# Patient Record
Sex: Female | Born: 1935 | Race: Black or African American | Hispanic: No | Marital: Single | Smoking: Current every day smoker
Health system: Southern US, Community
[De-identification: ages and names within clinical notes are randomized; demographics above are authoritative.]

## PROBLEM LIST (undated history)

## (undated) HISTORY — PX: CATARACT EXTRACTION: SUR2

---

## 2016-12-06 ENCOUNTER — Inpatient Hospital Stay: Payer: Medicare Other

## 2016-12-06 ENCOUNTER — Emergency Department: Payer: Medicare Other

## 2016-12-06 ENCOUNTER — Encounter: Payer: Self-pay | Admitting: Emergency Medicine

## 2016-12-06 ENCOUNTER — Inpatient Hospital Stay (HOSPITAL_COMMUNITY)
Admit: 2016-12-06 | Discharge: 2016-12-06 | Disposition: A | Discharge status: Home / Self Care | Payer: Medicare Other | Attending: Internal Medicine | Admitting: Internal Medicine

## 2016-12-06 ENCOUNTER — Inpatient Hospital Stay
Admission: EM | Admit: 2016-12-06 | Discharge: 2016-12-11 | DRG: 871 | Disposition: A | Discharge status: Skilled Nursing Facility | Payer: Medicare Other | Attending: Internal Medicine | Admitting: Internal Medicine

## 2016-12-06 DIAGNOSIS — R180 Malignant ascites: Secondary | ICD-10-CM | POA: Diagnosis present

## 2016-12-06 DIAGNOSIS — C799 Secondary malignant neoplasm of unspecified site: Secondary | ICD-10-CM | POA: Diagnosis not present

## 2016-12-06 DIAGNOSIS — R64 Cachexia: Secondary | ICD-10-CM | POA: Diagnosis present

## 2016-12-06 DIAGNOSIS — E43 Unspecified severe protein-calorie malnutrition: Secondary | ICD-10-CM | POA: Diagnosis present

## 2016-12-06 DIAGNOSIS — Z681 Body mass index (BMI) 19 or less, adult: Secondary | ICD-10-CM

## 2016-12-06 DIAGNOSIS — R110 Nausea: Secondary | ICD-10-CM | POA: Diagnosis not present

## 2016-12-06 DIAGNOSIS — I639 Cerebral infarction, unspecified: Secondary | ICD-10-CM | POA: Diagnosis not present

## 2016-12-06 DIAGNOSIS — B9620 Unspecified Escherichia coli [E. coli] as the cause of diseases classified elsewhere: Secondary | ICD-10-CM | POA: Diagnosis present

## 2016-12-06 DIAGNOSIS — Z23 Encounter for immunization: Secondary | ICD-10-CM | POA: Diagnosis present

## 2016-12-06 DIAGNOSIS — G8191 Hemiplegia, unspecified affecting right dominant side: Secondary | ICD-10-CM | POA: Diagnosis present

## 2016-12-06 DIAGNOSIS — N390 Urinary tract infection, site not specified: Secondary | ICD-10-CM | POA: Diagnosis present

## 2016-12-06 DIAGNOSIS — R160 Hepatomegaly, not elsewhere classified: Secondary | ICD-10-CM | POA: Diagnosis not present

## 2016-12-06 DIAGNOSIS — M6281 Muscle weakness (generalized): Secondary | ICD-10-CM | POA: Diagnosis not present

## 2016-12-06 DIAGNOSIS — D62 Acute posthemorrhagic anemia: Secondary | ICD-10-CM | POA: Diagnosis present

## 2016-12-06 DIAGNOSIS — R17 Unspecified jaundice: Secondary | ICD-10-CM

## 2016-12-06 DIAGNOSIS — R5381 Other malaise: Secondary | ICD-10-CM | POA: Diagnosis not present

## 2016-12-06 DIAGNOSIS — N179 Acute kidney failure, unspecified: Secondary | ICD-10-CM | POA: Diagnosis present

## 2016-12-06 DIAGNOSIS — R627 Adult failure to thrive: Secondary | ICD-10-CM | POA: Diagnosis present

## 2016-12-06 DIAGNOSIS — K922 Gastrointestinal hemorrhage, unspecified: Secondary | ICD-10-CM | POA: Diagnosis not present

## 2016-12-06 DIAGNOSIS — E872 Acidosis: Secondary | ICD-10-CM | POA: Diagnosis present

## 2016-12-06 DIAGNOSIS — R000 Tachycardia, unspecified: Secondary | ICD-10-CM | POA: Diagnosis present

## 2016-12-06 DIAGNOSIS — Z515 Encounter for palliative care: Secondary | ICD-10-CM | POA: Diagnosis not present

## 2016-12-06 DIAGNOSIS — D638 Anemia in other chronic diseases classified elsewhere: Secondary | ICD-10-CM | POA: Diagnosis present

## 2016-12-06 DIAGNOSIS — R6520 Severe sepsis without septic shock: Secondary | ICD-10-CM | POA: Diagnosis present

## 2016-12-06 DIAGNOSIS — E860 Dehydration: Secondary | ICD-10-CM | POA: Diagnosis present

## 2016-12-06 DIAGNOSIS — R2681 Unsteadiness on feet: Secondary | ICD-10-CM

## 2016-12-06 DIAGNOSIS — Z66 Do not resuscitate: Secondary | ICD-10-CM

## 2016-12-06 DIAGNOSIS — A419 Sepsis, unspecified organism: Secondary | ICD-10-CM | POA: Diagnosis present

## 2016-12-06 DIAGNOSIS — R748 Abnormal levels of other serum enzymes: Secondary | ICD-10-CM | POA: Diagnosis not present

## 2016-12-06 DIAGNOSIS — A4151 Sepsis due to Escherichia coli [E. coli]: Principal | ICD-10-CM | POA: Diagnosis present

## 2016-12-06 DIAGNOSIS — C221 Intrahepatic bile duct carcinoma: Secondary | ICD-10-CM | POA: Diagnosis present

## 2016-12-06 DIAGNOSIS — F17210 Nicotine dependence, cigarettes, uncomplicated: Secondary | ICD-10-CM | POA: Diagnosis present

## 2016-12-06 DIAGNOSIS — Z800 Family history of malignant neoplasm of digestive organs: Secondary | ICD-10-CM

## 2016-12-06 DIAGNOSIS — Z8673 Personal history of transient ischemic attack (TIA), and cerebral infarction without residual deficits: Secondary | ICD-10-CM

## 2016-12-06 DIAGNOSIS — R5383 Other fatigue: Secondary | ICD-10-CM | POA: Diagnosis not present

## 2016-12-06 DIAGNOSIS — L8990 Pressure ulcer of unspecified site, unspecified stage: Secondary | ICD-10-CM | POA: Insufficient documentation

## 2016-12-06 DIAGNOSIS — Z79899 Other long term (current) drug therapy: Secondary | ICD-10-CM | POA: Diagnosis not present

## 2016-12-06 DIAGNOSIS — Z7189 Other specified counseling: Secondary | ICD-10-CM | POA: Diagnosis not present

## 2016-12-06 DIAGNOSIS — L89152 Pressure ulcer of sacral region, stage 2: Secondary | ICD-10-CM | POA: Diagnosis present

## 2016-12-06 DIAGNOSIS — R531 Weakness: Secondary | ICD-10-CM | POA: Diagnosis not present

## 2016-12-06 DIAGNOSIS — I10 Essential (primary) hypertension: Secondary | ICD-10-CM | POA: Diagnosis present

## 2016-12-06 DIAGNOSIS — R1900 Intra-abdominal and pelvic swelling, mass and lump, unspecified site: Secondary | ICD-10-CM | POA: Diagnosis not present

## 2016-12-06 LAB — URINALYSIS, COMPLETE (UACMP) WITH MICROSCOPIC
GLUCOSE, UA: NEGATIVE mg/dL
Ketones, ur: NEGATIVE mg/dL
Nitrite: NEGATIVE
PROTEIN: 30 mg/dL — AB
SPECIFIC GRAVITY, URINE: 1.017 (ref 1.005–1.030)
pH: 5 (ref 5.0–8.0)

## 2016-12-06 LAB — BRAIN NATRIURETIC PEPTIDE: B NATRIURETIC PEPTIDE 5: 73 pg/mL (ref 0.0–100.0)

## 2016-12-06 LAB — BASIC METABOLIC PANEL
Anion gap: 14 (ref 5–15)
BUN: 44 mg/dL — AB (ref 6–20)
CALCIUM: 8.1 mg/dL — AB (ref 8.9–10.3)
CHLORIDE: 103 mmol/L (ref 101–111)
CO2: 18 mmol/L — ABNORMAL LOW (ref 22–32)
CREATININE: 1.5 mg/dL — AB (ref 0.44–1.00)
GFR, EST AFRICAN AMERICAN: 37 mL/min — AB (ref >60)
GFR, EST NON AFRICAN AMERICAN: 32 mL/min — AB (ref >60)
Glucose, Bld: 128 mg/dL — ABNORMAL HIGH (ref 65–99)
Potassium: 3.6 mmol/L (ref 3.5–5.1)
SODIUM: 135 mmol/L (ref 135–145)

## 2016-12-06 LAB — DIFFERENTIAL
Basophils Absolute: 0 10*3/uL (ref 0–0.1)
Basophils Relative: 0 %
EOS PCT: 0 %
Eosinophils Absolute: 0 10*3/uL (ref 0–0.7)
Lymphocytes Relative: 7 %
Lymphs Abs: 1.6 10*3/uL (ref 1.0–3.6)
MONO ABS: 0.6 10*3/uL (ref 0.2–0.9)
Monocytes Relative: 3 %
NEUTROS PCT: 90 %
Neutro Abs: 20.8 10*3/uL — ABNORMAL HIGH (ref 1.4–6.5)

## 2016-12-06 LAB — CREATININE, SERUM
Creatinine, Ser: 1.32 mg/dL — ABNORMAL HIGH (ref 0.44–1.00)
GFR calc non Af Amer: 37 mL/min — ABNORMAL LOW (ref >60)
GFR, EST AFRICAN AMERICAN: 43 mL/min — AB (ref >60)

## 2016-12-06 LAB — APTT: APTT: 28 s (ref 24–36)

## 2016-12-06 LAB — HEPATIC FUNCTION PANEL
ALBUMIN: 1.5 g/dL — AB (ref 3.5–5.0)
ALT: 76 U/L — AB (ref 14–54)
AST: 159 U/L — AB (ref 15–41)
Alkaline Phosphatase: 431 U/L — ABNORMAL HIGH (ref 38–126)
BILIRUBIN DIRECT: 6.8 mg/dL — AB (ref 0.1–0.5)
Indirect Bilirubin: 4.5 mg/dL — ABNORMAL HIGH (ref 0.3–0.9)
TOTAL PROTEIN: 6 g/dL — AB (ref 6.5–8.1)
Total Bilirubin: 11.3 mg/dL — ABNORMAL HIGH (ref 0.3–1.2)

## 2016-12-06 LAB — CBC
HCT: 24.4 % — ABNORMAL LOW (ref 35.0–47.0)
Hemoglobin: 8.4 g/dL — ABNORMAL LOW (ref 12.0–16.0)
MCH: 31.9 pg (ref 26.0–34.0)
MCHC: 34.3 g/dL (ref 32.0–36.0)
MCV: 93 fL (ref 80.0–100.0)
PLATELETS: 630 10*3/uL — AB (ref 150–440)
RBC: 2.62 MIL/uL — AB (ref 3.80–5.20)
RDW: 19.2 % — AB (ref 11.5–14.5)
WBC: 26.2 10*3/uL — AB (ref 3.6–11.0)

## 2016-12-06 LAB — LACTIC ACID, PLASMA
LACTIC ACID, VENOUS: 3.8 mmol/L — AB (ref 0.5–1.9)
LACTIC ACID, VENOUS: 3.8 mmol/L — AB (ref 0.5–1.9)

## 2016-12-06 LAB — TROPONIN I: Troponin I: 0.03 ng/mL (ref <0.03)

## 2016-12-06 LAB — PROTIME-INR
INR: 1.17
Prothrombin Time: 15 seconds (ref 11.4–15.2)

## 2016-12-06 LAB — PROCALCITONIN: Procalcitonin: 2.3 ng/mL

## 2016-12-06 MED ORDER — CEFTRIAXONE SODIUM-DEXTROSE 1-3.74 GM-% IV SOLR
1.0000 g | INTRAVENOUS | Status: DC
  Administered 2016-12-07 – 2016-12-10 (×4): 1 g via INTRAVENOUS
  Filled 2016-12-06 (×3): qty 50

## 2016-12-06 MED ORDER — INFLUENZA VAC SPLIT QUAD 0.5 ML IM SUSY
0.5000 mL | PREFILLED_SYRINGE | INTRAMUSCULAR | Status: AC
  Administered 2016-12-08: 0.5 mL via INTRAMUSCULAR
  Filled 2016-12-06 (×2): qty 0.5

## 2016-12-06 MED ORDER — CEFTRIAXONE SODIUM-DEXTROSE 1-3.74 GM-% IV SOLR
1.0000 g | Freq: Once | INTRAVENOUS | Status: AC
  Administered 2016-12-06: 1 g via INTRAVENOUS
  Filled 2016-12-06: qty 50

## 2016-12-06 MED ORDER — SENNOSIDES-DOCUSATE SODIUM 8.6-50 MG PO TABS: 1.0000 | ORAL_TABLET | Freq: Every evening | ORAL | Status: DC | PRN

## 2016-12-06 MED ORDER — SIMVASTATIN 40 MG PO TABS
40.0000 mg | ORAL_TABLET | Freq: Every day | ORAL | Status: DC
  Administered 2016-12-06: 20:00:00 40 mg via ORAL
  Filled 2016-12-06: qty 1

## 2016-12-06 MED ORDER — SODIUM CHLORIDE 0.9 % IV SOLN
1000.0000 mL | Freq: Once | INTRAVENOUS | Status: AC
  Administered 2016-12-06: 1000 mL via INTRAVENOUS

## 2016-12-06 MED ORDER — DEXTROSE 5 % IV SOLN: 1.0000 g | Freq: Once | INTRAVENOUS | Status: DC

## 2016-12-06 MED ORDER — PNEUMOCOCCAL VAC POLYVALENT 25 MCG/0.5ML IJ INJ
0.5000 mL | INJECTION | INTRAMUSCULAR | Status: AC
  Administered 2016-12-09: 0.5 mL via INTRAMUSCULAR
  Filled 2016-12-06: qty 0.5

## 2016-12-06 MED ORDER — ASPIRIN 81 MG PO CHEW
324.0000 mg | CHEWABLE_TABLET | Freq: Once | ORAL | Status: AC
  Administered 2016-12-06: 324 mg via ORAL
  Filled 2016-12-06: qty 4

## 2016-12-06 MED ORDER — ACETAMINOPHEN 325 MG PO TABS
650.0000 mg | ORAL_TABLET | ORAL | Status: DC | PRN
  Administered 2016-12-07 – 2016-12-10 (×2): 650 mg via ORAL
  Filled 2016-12-06 (×2): qty 2

## 2016-12-06 MED ORDER — STROKE: EARLY STAGES OF RECOVERY BOOK
Freq: Once | Status: AC
  Administered 2016-12-06: 15:00:00

## 2016-12-06 MED ORDER — ATORVASTATIN CALCIUM 20 MG PO TABS: 40.0000 mg | ORAL_TABLET | Freq: Every day | ORAL | Status: DC

## 2016-12-06 MED ORDER — ACETAMINOPHEN 160 MG/5ML PO SOLN: 650.0000 mg | ORAL | Status: DC | PRN

## 2016-12-06 MED ORDER — HEPARIN SODIUM (PORCINE) 5000 UNIT/ML IJ SOLN
5000.0000 [IU] | Freq: Three times a day (TID) | INTRAMUSCULAR | Status: DC
  Administered 2016-12-06 – 2016-12-07 (×3): 5000 [IU] via SUBCUTANEOUS
  Filled 2016-12-06 (×3): qty 1

## 2016-12-06 MED ORDER — ASPIRIN 325 MG PO TABS: 325.0000 mg | ORAL_TABLET | Freq: Every day | ORAL | Status: DC

## 2016-12-06 MED ORDER — ACETAMINOPHEN 650 MG RE SUPP: 650.0000 mg | RECTAL | Status: DC | PRN

## 2016-12-06 MED ORDER — ASPIRIN 300 MG RE SUPP: 300.0000 mg | Freq: Every day | RECTAL | Status: DC

## 2016-12-06 MED ORDER — SODIUM CHLORIDE 0.9 % IV SOLN
INTRAVENOUS | Status: DC
  Administered 2016-12-06 – 2016-12-10 (×7): via INTRAVENOUS

## 2016-12-06 NOTE — H&P (Signed)
[facility] NAME: Grace Summers    MR#:  [id number]  DATE OF BIRTH:  [birth year]  DATE OF ADMISSION:  12/06/2016  PRIMARY CARE PHYSICIAN: No PCP Per Patient   REQUESTING/REFERRING PHYSICIAN: Lavonia Drafts, MD  CHIEF COMPLAINT:   Chief Complaint  Patient presents with  . Weakness  . Leg Swelling  . Failure To Thrive   Failure to thrive, weight loss for a few months and right-sided weakness for 10 days HISTORY OF PRESENT ILLNESS:  Grace Summers  is a 83 y.o. female withNo past medical history. Was sent to ED by family member due to above chief complaints. The patient has had significant weight loss for the past a few days, he has decreased appetite and generalized weakness. She complains right side weakness for 10 days. But she denies any slurred speech, dysphagia or incontinence. She denies any fever or chills, no cough, phlegm or shortness of breath. Denies any dysuria, hematuria or urinary frequency but has dark urine. She was found leukocytosis and tachycardia. Urinalysis show UTI. CAT scan of head so acute or subacute CVA. She is treated with aspirin and Rocephin in the ED.  PAST MEDICAL HISTORY:  History reviewed. No pertinent past medical history.  PAST SURGICAL HISTORY:   Past Surgical History:  Procedure Laterality Date  . CATARACT EXTRACTION      SOCIAL HISTORY:   Social History  Substance Use Topics  . Smoking status: Current Every Day Smoker    Packs/day: 0.50    Years: 60.00    Types: Cigarettes  . Smokeless tobacco: Never Used  . Alcohol use No    FAMILY HISTORY:   Family History  Problem Relation Age of Onset  . Breast cancer Mother   . Liver cancer Father     DRUG ALLERGIES:  No Known Allergies  REVIEW OF SYSTEMS:   Review of Systems  Constitutional: Positive for malaise/fatigue and weight loss. Negative for chills and fever.  HENT: Negative for congestion, nosebleeds and sore throat.     Eyes: Negative for blurred vision and double vision.  Respiratory: Negative for cough, hemoptysis, sputum production, shortness of breath, wheezing and stridor.   Cardiovascular: Positive for leg swelling. Negative for chest pain and palpitations.  Gastrointestinal: Negative for abdominal pain, blood in stool, constipation, diarrhea, melena, nausea and vomiting.  Genitourinary: Negative for dysuria, frequency and hematuria.  Musculoskeletal: Negative for back pain and joint pain.  Skin: Negative for itching and rash.  Neurological: Positive for focal weakness and weakness. Negative for dizziness, tingling, tremors, sensory change, speech change, loss of consciousness and headaches.  Endo/Heme/Allergies: Does not bruise/bleed easily.  Psychiatric/Behavioral: Negative for depression. The patient is not nervous/anxious.     MEDICATIONS AT HOME:   Prior to Admission medications   Not on File      VITAL SIGNS:  Blood pressure (!) 167/67, pulse 86, temperature 97.3 F (36.3 C), temperature source Oral, resp. rate 18, height 5\' 5"  (1.651 m), weight 100 lb (45.4 kg), SpO2 99 %.  PHYSICAL EXAMINATION:  Physical Exam  GENERAL:  83 y.o.-year-old patient lying in the bed with no acute distress. Severe malnutrition EYES: Pupils equal, round, reactive to light and accommodation. Has scleral icterus. Extraocular muscles intact.  HEENT: Head atraumatic, normocephalic. Oropharynx and nasopharynx clear. Dry oral mucosa. NECK:  Supple, no jugular venous distention. No thyroid enlargement, no tenderness.  LUNGS: Normal breath sounds bilaterally, no wheezing, rales,rhonchi or crepitation. No use of accessory muscles  of respiration.  CARDIOVASCULAR: S1, S2 normal. No murmurs, rubs, or gallops.  ABDOMEN: Soft, nontender, nondistended. Bowel sounds present. No organomegaly or mass.  EXTREMITIES: No pedal edema, cyanosis, or clubbing.  NEUROLOGIC: Cranial nerves II through XII are intact. Muscle strength  4/5 in all extremities. Sensation intact. Gait not checked.  PSYCHIATRIC: The patient is alert and oriented x 3.  SKIN: No obvious rash, lesion, or ulcer.   LABORATORY PANEL:   CBC  Recent Labs Lab 12/06/16 0926  WBC 26.2*  HGB 8.4*  HCT 24.4*  PLT 630*   ------------------------------------------------------------------------------------------------------------------  Chemistries   Recent Labs Lab 12/06/16 0926  NA 135  K 3.6  CL 103  CO2 18*  GLUCOSE 128*  BUN 44*  CREATININE 1.50*  CALCIUM 8.1*   ------------------------------------------------------------------------------------------------------------------  Cardiac Enzymes  Recent Labs Lab 12/06/16 0926  TROPONINI 0.03*   ------------------------------------------------------------------------------------------------------------------  RADIOLOGY:  Dg Chest 1 View  Result Date: 12/06/2016 CLINICAL DATA:  Weight loss EXAM: CHEST 1 VIEW COMPARISON:  None. FINDINGS: There is slight scarring in each upper lobe and right base regions. There is no edema or consolidation. Heart size and pulmonary vascularity are normal. No adenopathy. Calcification is noted in the left carotid artery. IMPRESSION: Scattered areas of mild scarring. No edema or consolidation. There is calcification, incompletely visualized, in the left carotid artery. Electronically Signed   By: Lowella Grip III M.D.   On: 12/06/2016 10:49   Ct Head Wo Contrast  Result Date: 12/06/2016 CLINICAL DATA:  82 year old female with right lower extremity weakness for 1 week. Initial encounter. EXAM: CT HEAD WITHOUT CONTRAST TECHNIQUE: Contiguous axial images were obtained from the base of the skull through the vertex without intravenous contrast. COMPARISON:  None. FINDINGS: Brain: Extra-axial CSF over the frontal convexities appears related to age related cerebral volume loss. There is patchy hypodensity in the left pons (series 2, image 12). There is  relatively little deep gray matter and cerebral white matter hypodensity elsewhere ; there is some involvement of the right basal ganglia. No superimposed cortical encephalomalacia identified. No midline shift, ventriculomegaly, mass effect, evidence of mass lesion, intracranial hemorrhage or evidence of cortically based acute infarction. Vascular: Calcified atherosclerosis at the skull base. No suspicious intracranial vascular hyperdensity. Skull: Osteopenia. Hyperostosis of the calvarium. No acute osseous abnormality identified. Sinuses/Orbits: Clear. Other: No acute orbit or scalp soft tissue finding. IMPRESSION: 1. Age indeterminate hypodensity in the left thalamus is suspicious for an acute to subacute lacunar infarct in this clinical setting. No associated hemorrhage or mass effect. 2. Otherwise suspect relatively mild for age chronic small vessel disease. Electronically Signed   By: Genevie Ann M.D.   On: 12/06/2016 10:39      IMPRESSION AND PLAN:   Sepsis with UTI. The patient will be admitted to medical floor. Continue Rocephin, follow-up CBC and cultures.  Acute or subacute CVA. Continue aspirin, A. fib for, check lipid panel, MRI/MRA of brain, carotid duplex, echocardiogram,  neuro check and neurology consult.  Acute renal failure due to dehydration. Start IV fluid support and follow-up BMP. Lactic acidosis, treatment as above, follow-up lactic acid level. Failure to thrive. Dietitian consult. May need workup for malignancy. May get a CAT scan of abdomen and pelvis if renal function improves. I will get abdominal ultrasound and liver function test.  Jaundice, follow-up abdominal ultrasound and liver function test.  Anemia. Possible due to chronic disease and malnutrition.  Tobacco abuse. Smoking cessation was counseled for 3 minutes, nicotine patch.  All the records are  reviewed and case discussed with ED provider. Management plans discussed with the patient, sister and daughter and  they are in agreement.  CODE STATUS: Full code  TOTAL TIME TAKING CARE OF THIS PATIENT: 65 minutes.    Demetrios Loll M.D on 12/06/2016 at 2:11 PM  Between 7am to 6pm - Pager - [phone]  After 6pm go to [email or web address] - [employer] [facility]  Office  [phone]  CC: Primary care physician; No PCP Per Patient   Note: This dictation was prepared with Dragon dictation along with smaller phrase technology. Any transcriptional errors that result from this process are unintentional.

## 2016-12-06 NOTE — Progress Notes (Signed)
Pharmacy Antibiotic Note  Grace Summers is a 83 y.o. female admitted on 12/06/2016 with UTI.  Pharmacy has been consulted for ceftriaxone dosing.  Plan: Ceftriaxone 1 g IV daily  Height: 5\' 3"  (160 cm) Weight: 91 lb 8 oz (41.5 kg) IBW/kg (Calculated) : 52.4  Temp (24hrs), Avg:97.5 F (36.4 C), Min:97.3 F (36.3 C), Max:97.6 F (36.4 C)   Recent Labs Lab 12/06/16 0926 12/06/16 1107 12/06/16 1331 12/06/16 1421  WBC 26.2*  --   --   --   CREATININE 1.50*  --   --  1.32*  LATICACIDVEN  --  3.8* 3.8*  --     Estimated Creatinine Clearance: 22.3 mL/min (by C-G formula based on SCr of 1.32 mg/dL (H)).    No Known Allergies  Antimicrobials this admission: CTX 1/23 >>   Dose adjustments this admission:  Microbiology results: 1/23 BCx: Sent 1/23 UCx: Sent   Thank you for allowing pharmacy to be a part of this patient's care.  Lenis Noon, PharmD, BCPS Clinical Pharmacist 12/06/2016 3:20 PM

## 2016-12-06 NOTE — ED Triage Notes (Signed)
Pt presents to ED with decreased appetite, swelling in her feet, and generalized weakness at this time. Pt lives at home with her sister. Pt presents with facial symmetry intact, grip strengths weak and equal bilaterally. Pt is alert and oriented at this time.

## 2016-12-06 NOTE — Progress Notes (Signed)
PT Cancellation Note  Patient Details Name: Grace Summers MRN: [id number] DOB: 1934/06/19   Cancelled Treatment:    Reason Eval/Treat Not Completed: Patient at procedure or test/unavailable Pt out of room for MRI, will try back tomorrow as appropriate.  Kreg Shropshire, DPT 12/06/2016, 4:11 PM

## 2016-12-06 NOTE — Progress Notes (Signed)
Notified  Dr Bridgett Larsson of small amt of bright bleeding per rectum. Dr Bridgett Larsson said he had ordered   Sung Amabile for pt. prioe consitpation per pt

## 2016-12-06 NOTE — Progress Notes (Signed)
OT Cancellation Note  Patient Details Name: Grace Summers MRN: [id number] DOB: Mar 31, 1934   Cancelled Treatment:    Reason Eval/Treat Not Completed: Patient at procedure or test/ unavailable. Order received, chart reviewed. Pt off floor for MRI per nsg. Will re-attempt OT evaluation at later date/time as appropriate.  Corky Sox, OTR/L 12/06/2016, 4:00 PM

## 2016-12-06 NOTE — ED Notes (Signed)
Patient transported to X-ray 

## 2016-12-06 NOTE — ED Provider Notes (Signed)
[facility] Emergency Department Provider Note   ____________________________________________    I have reviewed the triage vital signs and the nursing notes.   HISTORY  Chief Complaint Weakness; Leg Swelling; and Failure To Thrive     HPI Grace Summers is a 83 y.o. female who presents with several complaints. Patient reports over the last several months she has had significant weight loss, loss of appetite and general fatigue. She also developed right leg weakness which she states seems to become more pronounced in the last week. She denies chest pain. She denies headache. She denies cough or shortness of breath. She is a smoker. No abdominal pain. No black stools   History reviewed. No pertinent past medical history.  There are no active problems to display for this patient.   Past Surgical History:  Procedure Laterality Date  . CATARACT EXTRACTION      Prior to Admission medications   Not on File     Allergies Patient has no known allergies.  History reviewed. No pertinent family history.  Social History Social History  Substance Use Topics  . Smoking status: Current Every Day Smoker    Packs/day: 0.50    Types: Cigarettes  . Smokeless tobacco: Never Used  . Alcohol use No    Review of Systems  Constitutional: No fever/chills  Cardiovascular: Denies chest pain.No cough Respiratory: Denies shortness of breath. Gastrointestinal: No abdominal pain.  No nausea, no vomiting.   Genitourinary: Negative for dysuria. Musculoskeletal: Negative for back pain. Skin: Negative for rash. Neurological: Negative for headaches , right leg weakness as above  10-point ROS otherwise negative.  ____________________________________________   PHYSICAL EXAM:  VITAL SIGNS: ED Triage Vitals [12/06/16 0910]  Enc Vitals Group     BP (!) 146/80     Pulse Rate (!) 110     Resp 18     Temp 97.6 F (36.4 C)     Temp Source Oral     SpO2 98  %     Weight 100 lb (45.4 kg)     Height 5\' 5"  (1.651 m)     Head Circumference      Peak Flow      Pain Score      Pain Loc      Pain Edu?      Excl. in [place]?     Constitutional: Alert and oriented. No acute distress. Pleasant and interactive, Cachectic Eyes: Conjunctivae are normal.  Head: Atraumatic. Nose: No congestion/rhinnorhea. Mouth/Throat: Mucous membranes are moist.   Neck:  Painless ROM Cardiovascular: Tachycardia, regular rhythm. Grossly normal heart sounds.  Good peripheral circulation. Respiratory: Normal respiratory effort.  No retractions. Lungs CTAB. Gastrointestinal: Soft and nontender. No distention.  No CVA tenderness. Genitourinary: deferred Musculoskeletal: 2+ edema right lower extremity.  Warm and well perfused Neurologic:  Normal speech and language. No gross focal neurologic deficits are appreciated.  Skin:  Skin is warm, dry and intact. No rash noted. Psychiatric: Mood and affect are normal. Speech and behavior are normal.  ____________________________________________   LABS (all labs ordered are listed, but only abnormal results are displayed)  Labs Reviewed  BASIC METABOLIC PANEL - Abnormal; Notable for the following:       Result Value   CO2 18 (*)    Glucose, Bld 128 (*)    BUN 44 (*)    Creatinine, Ser 1.50 (*)    Calcium 8.1 (*)    GFR calc non Af Amer 32 (*)  GFR calc Af Amer 37 (*)    All other components within normal limits  CBC - Abnormal; Notable for the following:    WBC 26.2 (*)    RBC 2.62 (*)    Hemoglobin 8.4 (*)    HCT 24.4 (*)    RDW 19.2 (*)    Platelets 630 (*)    All other components within normal limits  TROPONIN I - Abnormal; Notable for the following:    Troponin I 0.03 (*)    All other components within normal limits  CULTURE, BLOOD (ROUTINE X 2)  CULTURE, BLOOD (ROUTINE X 2)  BRAIN NATRIURETIC PEPTIDE  URINALYSIS, COMPLETE (UACMP) WITH MICROSCOPIC  LACTIC ACID, PLASMA  LACTIC ACID, PLASMA  DIFFERENTIAL    DIFFERENTIAL   ____________________________________________  EKG  ED ECG REPORT I, Lavonia Drafts, the attending physician, personally viewed and interpreted this ECG.  Date: 12/06/2016 EKG Time: 9:27 AM Rate: 107 Rhythm: Sinus tachycardia QRS Axis: normal Intervals: normal ST/T Wave abnormalities: normal Conduction Disturbances: none   ____________________________________________  RADIOLOGY  Chest x-ray remarkable CT scan demonstrates acute to subacute lacunar infarct left thalamus ____________________________________________   PROCEDURES  Procedure(s) performed: No    Critical Care performed: No ____________________________________________   INITIAL IMPRESSION / ASSESSMENT AND PLAN / ED COURSE  Pertinent labs & imaging results that were available during my care of the patient were reviewed by me and considered in my medical decision making (see chart for details).  Patient presents with weight loss and weakness fatigue and general failure to thrive. She is cachectic and frail. Lab work is significant for elevated white blood cell count, anemia and dehydration. She is afebrile and no evidence of bacterial infection at this time, suspect dehydration as the cause of her tachycardia. CT head demonstrates acute subacute lacunar infarct which could explain her right leg weakness. She will require admission for further evaluation  ----------------------------------------- 12:33 PM on 12/06/2016 -----------------------------------------  Notified of elevated lactic acid, subsequently nurse was able to obtain urine via in and out catheter which certainly looks infected, we will treat with Rocephin. Blood pressure is unremarkable heart rate has improved no indication for 30 MLS per kilogram.  ____________________________________________   FINAL CLINICAL IMPRESSION(S) / ED DIAGNOSES  Final diagnoses:  Sepsis, due to unspecified organism [facility])  Cerebrovascular  accident (CVA), unspecified mechanism ([place])      NEW MEDICATIONS STARTED DURING THIS VISIT:  New Prescriptions   No medications on file     Note:  This document was prepared using Dragon voice recognition software and may include unintentional dictation errors.    Lavonia Drafts, MD 12/06/16 1233

## 2016-12-07 ENCOUNTER — Inpatient Hospital Stay: Payer: Medicare Other

## 2016-12-07 DIAGNOSIS — L8990 Pressure ulcer of unspecified site, unspecified stage: Secondary | ICD-10-CM | POA: Insufficient documentation

## 2016-12-07 DIAGNOSIS — R531 Weakness: Secondary | ICD-10-CM

## 2016-12-07 LAB — ECHOCARDIOGRAM COMPLETE
HEIGHTINCHES: 63 in
WEIGHTICAEL: 1464 [oz_av]

## 2016-12-07 LAB — LIPID PANEL
Cholesterol: 157 mg/dL (ref 0–200)
HDL: <10 mg/dL — ABNORMAL LOW (ref >40)
TRIGLYCERIDES: 206 mg/dL — AB (ref <150)
VLDL: 41 mg/dL — AB (ref 0–40)

## 2016-12-07 MED ORDER — ASPIRIN EC 81 MG PO TBEC: 81.0000 mg | DELAYED_RELEASE_TABLET | Freq: Every day | ORAL | Status: DC

## 2016-12-07 MED ORDER — ORAL CARE MOUTH RINSE
15.0000 mL | Freq: Two times a day (BID) | OROMUCOSAL | Status: DC
  Administered 2016-12-07 – 2016-12-11 (×8): 15 mL via OROMUCOSAL

## 2016-12-07 MED ORDER — ENSURE ENLIVE PO LIQD
237.0000 mL | Freq: Two times a day (BID) | ORAL | Status: DC
  Administered 2016-12-07 – 2016-12-10 (×3): 237 mL via ORAL

## 2016-12-07 MED ORDER — ADULT MULTIVITAMIN W/MINERALS CH
1.0000 | ORAL_TABLET | Freq: Every day | ORAL | Status: DC
  Administered 2016-12-07 – 2016-12-11 (×5): 1 via ORAL
  Filled 2016-12-07 (×5): qty 1

## 2016-12-07 MED ORDER — PANTOPRAZOLE SODIUM 40 MG PO TBEC
40.0000 mg | DELAYED_RELEASE_TABLET | Freq: Every day | ORAL | Status: DC
  Administered 2016-12-07 – 2016-12-11 (×5): 40 mg via ORAL
  Filled 2016-12-07 (×5): qty 1

## 2016-12-07 NOTE — Progress Notes (Signed)
[facility] NAME: Lorenza Propes    MR#:  [id number]  DATE OF BIRTH:  [birth year]  SUBJECTIVE:   Came in with weakness and found to have jaundice, uti Poor historian REVIEW OF SYSTEMS:   Review of Systems  Constitutional: Negative for chills, fever and weight loss.  HENT: Negative for ear discharge, ear pain and nosebleeds.   Eyes: Negative for blurred vision, pain and discharge.  Respiratory: Negative for sputum production, shortness of breath, wheezing and stridor.   Cardiovascular: Negative for chest pain, palpitations, orthopnea and PND.  Gastrointestinal: Negative for abdominal pain, diarrhea, nausea and vomiting.  Genitourinary: Negative for frequency and urgency.  Musculoskeletal: Positive for joint pain. Negative for back pain.  Neurological: Positive for weakness. Negative for sensory change, speech change and focal weakness.  Psychiatric/Behavioral: Negative for depression and hallucinations. The patient is not nervous/anxious.    Tolerating Diet:some Tolerating PT: SNF  DRUG ALLERGIES:  No Known Allergies  VITALS:  Blood pressure (!) 108/48, pulse 90, temperature 98.7 F (37.1 C), temperature source Oral, resp. rate 18, height 5\' 3"  (1.6 m), weight 41.5 kg (91 lb 8 oz), SpO2 100 %.  PHYSICAL EXAMINATION:   Physical Exam  GENERAL:  83 y.o.-year-old patient lying in the bed with no acute distress. Weak, cachectic EYES: Pupils equal, round, reactive to light and accommodation. ++ scleral icterus. Extraocular muscles intact.  HEENT: Head atraumatic, normocephalic. Oropharynx and nasopharynx clear.  NECK:  Supple, no jugular venous distention. No thyroid enlargement, no tenderness.  LUNGS: Normal breath sounds bilaterally, no wheezing, rales, rhonchi. No use of accessory muscles of respiration.  CARDIOVASCULAR: S1, S2 normal. No murmurs, rubs, or gallops.  ABDOMEN: Soft, nontender, nondistended. Bowel sounds  present. No organomegaly or mass.  EXTREMITIES: No cyanosis, clubbing or edema b/l.    NEUROLOGIC grossly non focal PSYCHIATRIC:  patient is alert and pleasantly confused SKIN: No obvious rash, lesion, or ulcer.   LABORATORY PANEL:  CBC  Recent Labs Lab 12/06/16 0926  WBC 26.2*  HGB 8.4*  HCT 24.4*  PLT 630*    Chemistries   Recent Labs Lab 12/06/16 0926 12/06/16 1421  NA 135  --   K 3.6  --   CL 103  --   CO2 18*  --   GLUCOSE 128*  --   BUN 44*  --   CREATININE 1.50* 1.32*  CALCIUM 8.1*  --   AST  --  159*  ALT  --  76*  ALKPHOS  --  431*  BILITOT  --  11.3*   Cardiac Enzymes  Recent Labs Lab 12/06/16 0926  TROPONINI 0.03*   RADIOLOGY:  Dg Chest 1 View  Result Date: 12/06/2016 CLINICAL DATA:  Weight loss EXAM: CHEST 1 VIEW COMPARISON:  None. FINDINGS: There is slight scarring in each upper lobe and right base regions. There is no edema or consolidation. Heart size and pulmonary vascularity are normal. No adenopathy. Calcification is noted in the left carotid artery. IMPRESSION: Scattered areas of mild scarring. No edema or consolidation. There is calcification, incompletely visualized, in the left carotid artery. Electronically Signed   By: Lowella Grip III M.D.   On: 12/06/2016 10:49   Ct Head Wo Contrast  Result Date: 12/06/2016 CLINICAL DATA:  83 year old female with right lower extremity weakness for 1 week. Initial encounter. EXAM: CT HEAD WITHOUT CONTRAST TECHNIQUE: Contiguous axial images were obtained from the base of the skull through the vertex without intravenous contrast. COMPARISON:  None. FINDINGS: Brain: Extra-axial CSF over the frontal convexities appears related to age related cerebral volume loss. There is patchy hypodensity in the left pons (series 2, image 12). There is relatively little deep gray matter and cerebral white matter hypodensity elsewhere ; there is some involvement of the right basal ganglia. No superimposed cortical  encephalomalacia identified. No midline shift, ventriculomegaly, mass effect, evidence of mass lesion, intracranial hemorrhage or evidence of cortically based acute infarction. Vascular: Calcified atherosclerosis at the skull base. No suspicious intracranial vascular hyperdensity. Skull: Osteopenia. Hyperostosis of the calvarium. No acute osseous abnormality identified. Sinuses/Orbits: Clear. Other: No acute orbit or scalp soft tissue finding. IMPRESSION: 1. Age indeterminate hypodensity in the left thalamus is suspicious for an acute to subacute lacunar infarct in this clinical setting. No associated hemorrhage or mass effect. 2. Otherwise suspect relatively mild for age chronic small vessel disease. Electronically Signed   By: Genevie Ann M.D.   On: 12/06/2016 10:39   Mr Brain Wo Contrast  Result Date: 12/06/2016 CLINICAL DATA:  RIGHT-sided weakness for 10 days, decreased appetite and weight loss for a few days. Urinary tract infection. Follow-up stroke. EXAM: MRI HEAD WITHOUT CONTRAST MRA HEAD WITHOUT CONTRAST TECHNIQUE: Multiplanar, multiecho pulse sequences of the brain and surrounding structures were obtained without intravenous contrast. Angiographic images of the head were obtained using MRA technique without contrast. COMPARISON:  CT HEAD December 06, 2016 1028 hours FINDINGS: MRI HEAD FINDINGS BRAIN: No reduced diffusion to suggest acute ischemia with particular attention to LEFT thalamus. No susceptibility artifact to suggest hemorrhage. The ventricles and sulci are normal for patient's age. 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 A few scattered subcentimeter supratentorial white matter FLAIR T2 hyperintensities compatible with mild chronic small vessel ischemic disease, less than expected for age. No suspicious parenchymal signal, masses or mass effect. No abnormal extra-axial fluid collections. No extra-axial masses though, contrast enhanced sequences would be more sensitive.  VASCULAR: Normal major intracranial vascular flow voids present at skull base. SKULL AND UPPER CERVICAL SPINE: No abnormal sellar expansion. No suspicious calvarial bone marrow signal. Craniocervical junction maintained. SINUSES/ORBITS: The mastoid air-cells and included paranasal sinuses are well-aerated. Status post bilateral ocular lens implants. The included ocular globes and orbital contents are non-suspicious. OTHER: Patient is edentulous. MRA HEAD FINDINGS- mildly motion degraded examination. ANTERIOR CIRCULATION: Flow related enhancement of the included cervical, petrous, cavernous and supraclinoid internal carotid arteries. Moderate stenosis RIGHT anterior genu of the internal carotid artery, Patent anterior communicating artery. Occluded proximal RIGHT A1 segment, minimal distal retrograde flow. Patent anterior communicating artery and bilateral A2 segments and distal ACA's. Flow related enhancement bilateral MCA arteries. Moderate tandem stenoses bilateral MCA, mild luminal irregularity bilateral ACA. POSTERIOR CIRCULATION: RIGHT vertebral artery is dominant. Basilar artery is patent, with normal flow related enhancement of the main branch vessels. Flow related enhancement of the posterior cerebral arteries. Tiny LEFT posterior communicating artery present. Multifocal moderate stenoses. ANATOMIC VARIANTS: Fetal origin RIGHT posterior cerebral artery. IMPRESSION: MRI HEAD: No acute intracranial process, specifically no acute ischemia. 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 MRA HEAD: No emergent large vessel occlusion on this mildly motion degraded examination. Multifocal anterior and posterior circulation moderate stenoses compatible with atherosclerosis. Chronically occluded proximal RIGHT A1 segment with patent anterior communicating artery. Electronically Signed   By: Elon Alas M.D.   On: 12/06/2016 16:46   US Carotid Bilateral (at  [facility] Only)  Result Date: 12/06/2016 CLINICAL DATA:  Right-sided weakness for 10 days EXAM: BILATERAL CAROTID DUPLEX ULTRASOUND TECHNIQUE: Pearline Cables scale imaging, color Doppler and duplex ultrasound were performed of bilateral carotid and vertebral arteries in the neck. COMPARISON:  None. FINDINGS: Criteria: Quantification of carotid stenosis is based on velocity parameters that correlate the residual internal carotid diameter with NASCET-based stenosis levels, using the diameter of the distal internal carotid lumen as the denominator for stenosis measurement. The following velocity measurements were obtained: RIGHT ICA:  101 cm/sec CCA:  88 cm/sec SYSTOLIC ICA/CCA RATIO:  1.1 DIASTOLIC ICA/CCA RATIO:  2.6 ECA:  85 cm/sec LEFT ICA:  94 cm/sec CCA:  93 cm/sec SYSTOLIC ICA/CCA RATIO:  1.0 DIASTOLIC ICA/CCA RATIO:  1.4 ECA:  187 cm/sec RIGHT CAROTID ARTERY: Moderate calcified plaque in the bulb. Low resistance internal carotid Doppler pattern. RIGHT VERTEBRAL ARTERY:  Antegrade. LEFT CAROTID ARTERY: Moderate mixed plaque along the wall of the bulb. Low resistance internal carotid Doppler pattern is preserved. LEFT VERTEBRAL ARTERY:  Antegrade. IMPRESSION: Less than 50% stenosis in the right and left internal carotid arteries. Electronically Signed   By: Marybelle Killings M.D.   On: 12/06/2016 16:05   Mr Jodene Nam Head/brain [id number] Cm  Result Date: 12/06/2016 CLINICAL DATA:  RIGHT-sided weakness for 10 days, decreased appetite and weight loss for a few days. Urinary tract infection. Follow-up stroke. EXAM: MRI HEAD WITHOUT CONTRAST MRA HEAD WITHOUT CONTRAST TECHNIQUE: Multiplanar, multiecho pulse sequences of the brain and surrounding structures were obtained without intravenous contrast. Angiographic images of the head were obtained using MRA technique without contrast. COMPARISON:  CT HEAD December 06, 2016 1028 hours FINDINGS: MRI HEAD FINDINGS BRAIN: No reduced diffusion to suggest acute ischemia with particular attention  to LEFT thalamus. No susceptibility artifact to suggest hemorrhage. The ventricles and sulci are normal for patient's age. 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 A few scattered subcentimeter supratentorial white matter FLAIR T2 hyperintensities compatible with mild chronic small vessel ischemic disease, less than expected for age. No suspicious parenchymal signal, masses or mass effect. No abnormal extra-axial fluid collections. No extra-axial masses though, contrast enhanced sequences would be more sensitive. VASCULAR: Normal major intracranial vascular flow voids present at skull base. SKULL AND UPPER CERVICAL SPINE: No abnormal sellar expansion. No suspicious calvarial bone marrow signal. Craniocervical junction maintained. SINUSES/ORBITS: The mastoid air-cells and included paranasal sinuses are well-aerated. Status post bilateral ocular lens implants. The included ocular globes and orbital contents are non-suspicious. OTHER: Patient is edentulous. MRA HEAD FINDINGS- mildly motion degraded examination. ANTERIOR CIRCULATION: Flow related enhancement of the included cervical, petrous, cavernous and supraclinoid internal carotid arteries. Moderate stenosis RIGHT anterior genu of the internal carotid artery, Patent anterior communicating artery. Occluded proximal RIGHT A1 segment, minimal distal retrograde flow. Patent anterior communicating artery and bilateral A2 segments and distal ACA's. Flow related enhancement bilateral MCA arteries. Moderate tandem stenoses bilateral MCA, mild luminal irregularity bilateral ACA. POSTERIOR CIRCULATION: RIGHT vertebral artery is dominant. Basilar artery is patent, with normal flow related enhancement of the main branch vessels. Flow related enhancement of the posterior cerebral arteries. Tiny LEFT posterior communicating artery present. Multifocal moderate stenoses. ANATOMIC VARIANTS: Fetal origin RIGHT posterior cerebral artery. IMPRESSION: MRI HEAD:  No acute intracranial process, specifically no acute ischemia. 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 MRA HEAD: No emergent large vessel occlusion on this mildly motion degraded examination. Multifocal anterior and posterior circulation moderate stenoses compatible with atherosclerosis. Chronically occluded proximal RIGHT A1 segment with patent anterior communicating  artery. Electronically Signed   By: Elon Alas M.D.   On: 12/06/2016 16:46   US Abdomen Limited Ruq  Result Date: 12/07/2016 CLINICAL DATA:  Jaundice EXAM: US ABDOMEN LIMITED - RIGHT UPPER QUADRANT COMPARISON:  None FINDINGS: Gallbladder: A normal gallbladder is not visualized. Questionable gallbladder filled with shadowing calculi and sludge versus a bowel loop adjacent to the liver. No sonographic Murphy sign. Common bile duct: Diameter: 10 mm diameter, dilated Liver: Intrahepatic biliary dilatation. Nodular margins with heterogeneous parenchymal echogenicity. Multiple hypoechoic masses suspicious for metastatic disease. Largest nodule measures 2.5 x 3.1 x 4.2 cm in the LEFT lobe. Small amounts of scattered ascites throughout abdomen. Due to shadowing from gallstones and bowel gas, unable to identify a discrete mass obstructing the extrahepatic biliary tree at the porta hepatis or pancreatic head. IMPRESSION: A normal appearing gallbladder is not visualized. A structure with shadowing echogenic foci adjacent to the liver edge may represent a gallbladder filled with shadowing calculi and sludge though a bowel loop is not entirely excluded; recommend correlation with surgical history. Nodular liver with multiple hepatic masses suspicious for hepatic metastatic disease. Intrahepatic biliary dilatation and proximal extrahepatic biliary dilatation, though cause of the obstruction is not identified. Further assessment by CT abdomen with contrast or MR imaging with and without contrast  recommended to assess liver, extrahepatic biliary tree, and gallbladder. Electronically Signed   By: Lavonia Dana M.D.   On: 12/07/2016 11:45   ASSESSMENT AND PLAN:   Eztli Deso  is a 83 y.o. female withNo past medical history. Was sent to ED by family member due to above chief complaints. The patient has had significant weight loss for the past a few days, he has decreased appetite and generalized weakness. She complains right side weakness for 10 days. But she denies any slurred speech, dysphagia or incontinence. She denies any fever or chills, no cough, phlegm or shortness of breath  * Sepsis due to  UTI. - Continue Rocephin, follow-up CBC and cultures.  *Jaundice, follow-up abdominal ultrasound and liver function test. -obstructive -USG shows Nodular liver with multiple hepatic masses suspicious for hepatic metastatic disease -MRCP ordered _GI consult -oncology consult after MRCP -?palliative care  *right sided weakness -w/u fro stroke Negative -old cva on CT head  *GI bleed ?upper -dark marron blood clots -d/c asa -GI consult -h/h bid  *Acute renal failure due to dehydration. Start IV fluid support and follow-up BMP.  * Lactic acidosis, treatment as above, follow-up lactic acid level.  * Failure to thrive. Dietitian consult. May need workup for malignancy.  *Anemia. Possible due to chronic disease and malnutrition.  *Tobacco abuse. Smoking cessation was counseled for more than 4 minutes, nicotine patch.  Case discussed with Care Management/Social Worker. Management plans discussed with the patient, family and they are in agreement.  CODE STATUS: FULL  DVT Prophylaxis: SCD TOTAL TIME TAKING CARE OF THIS PATIENT: 30 minutes.  >50% time spent on counselling and coordination of care  POSSIBLE D/C IN 1-2 DAYS, DEPENDING ON CLINICAL CONDITION.  Note: This dictation was prepared with Dragon dictation along with smaller phrase technology. Any transcriptional  errors that result from this process are unintentional.  Antwaine Boomhower M.D on 12/07/2016 at 5:22 PM  Between 7am to 6pm - Pager - [phone]  After 6pm go to [email or web address] - password [email or web address] [facility]  [facility]  Office  [phone]  CC: Primary care physician; No PCP Per Patient

## 2016-12-07 NOTE — Progress Notes (Signed)
Initial Nutrition Assessment  DOCUMENTATION CODES:   Severe malnutrition in context of chronic illness, Underweight  INTERVENTION:  Monitor magnesium, potassium, and phosphorus daily for at least 3 days, MD to replete as needed, as pt is at risk for refeeding syndrome given severe malnutrition.  Provide Ensure Enlive po BID, each supplement provides 350 kcal and 20 grams of protein.  Provide multivitamin with minerals daily.  Encouraged patient to order some source of protein at each meal since she does not like meat. Discussed milk, yogurt, cottage cheese, and legumes.   NUTRITION DIAGNOSIS:   Malnutrition (Severe) related to chronic illness as evidenced by 32 percent weight loss over 1 year, severe depletion of body fat, severe depletion of muscle mass.  GOAL:   Patient will meet greater than or equal to 90% of their needs  MONITOR:   PO intake, Supplement acceptance, Labs, I & O's, Weight trends  REASON FOR ASSESSMENT:   Malnutrition Screening Tool, Consult Assessment of nutrition requirement/status  ASSESSMENT:   83 year old female with no past medical history who presents with significant weight loss, decreased appetite, and generalized weakness. Patient found to have sepsis with UTI, acute or subacute CVA, acute renal failure due to dehydration, jaundice.    Spoke with patient at bedside. At time of assessment patient was NPO pending [place] Abd, but is now on heart healthy diet. She reports she has had a poor appetite since Thanksgiving. She lives with her sister who prepares meals for her. She has been eating cereal, grits, and oatmeal, and also reports drinking 2-3 Ensure daily. Patient reports she has lost her appetite for meat and most other protein-containing foods, but is able to eat yogurt. Patient denies N/V or abdominal pain.   Patient reports UBW was 133 lbs and that she has lost 42 lbs (32% body weight) over the past year or less, which is significant for time  frame.   Medications reviewed and include: ceftriaxone, NS @ 75 ml/hr.   Labs reviewed: Creatinine 1.32, elevated Lactic Acid.   Nutrition-Focused physical exam completed. Findings are severe fat depletion, severe muscle depletion, and no edema.   Diet Order:  Diet Heart Room service appropriate? Yes; Fluid consistency: Thin  Skin:  Wound (see comment) (Stg II to coccyx)  Last BM:  12/05/2016  Height:   Ht Readings from Last 1 Encounters:  12/06/16 5\' 3"  (1.6 m)    Weight:   Wt Readings from Last 1 Encounters:  12/06/16 91 lb 8 oz (41.5 kg)    Ideal Body Weight:  52.3 kg  BMI:  Body mass index is 16.21 kg/m.  Estimated Nutritional Needs:   Kcal:  1245-1450 (30-35 kcal/kg)  Protein:  62-75 grams (1.5-1.8 grams/kg)  Fluid:  1.2 L/day   EDUCATION NEEDS:   No education needs identified at this time  Willey Blade, MS, RD, LDN Pager: [phone] After Hours Pager: [phone]

## 2016-12-07 NOTE — Progress Notes (Signed)
SLP Cancellation Note  Patient Details Name: Grace Summers MRN: [id number] DOB: 02-19-1934   Cancelled treatment:       Reason Eval/Treat Not Completed: SLP screened, no needs identified, will sign off (NSG consulted; pt chart reviewed). Consulted pt; denied any difficulty with speech, stated she was at her baseline. Denied any difficulty talking with NSG or any trouble getting her words out. Of note- pt did appear to have some mumbled speech. NSG denies any need for further skilled ST services. NSG to re-consult if any change in status during stay.    Eulogio Ditch, B.S Graduate Clinician  12/07/2016, 1:26 PM  This information has been reviewed and agreed upon by this supervising clinician.  Orinda Kenner, MS, CCC-SLP   ST services will be available for f/u if any concern for Dysphagia while admitted.  Orinda Kenner, [place], CCC-SLP

## 2016-12-07 NOTE — Care Management (Signed)
Admitted to [facility] with the diagnosis of sepsis. Lives with sister [name]. Daughter is [name] [phone]) No primary care physician. Does go to the eye doctor on a yearly basic. Gets prescriptions filled at drug store in [place]. No home health. No skilled facility. No falls. Uses no aids for ambulation. Takes care of all basic activities of daily living himself, doesn't drive. Sister does errands. Either sister or daughter will drive. Telephone call to daughter, [name]. Discussed arranging a primary physician before discharge. Discussed [facility] accepting new patients. States she will call [facility] to arrange follow-up appointment. Will leave list of physicians accepting new patients in room.                                                                             Shelbie Ammons RN MSN CCM Care Management

## 2016-12-07 NOTE — Plan of Care (Signed)
Problem: Bowel/Gastric: Goal: Will not experience complications related to bowel motility Outcome: Not Progressing Pt had brown stool mixed with some dark red blood and clots. Dr Posey Pronto is notified. Orders received.

## 2016-12-07 NOTE — Consult Note (Signed)
Reason for Consult:FTT and right sided weakness Referring Physician: Posey Pronto  CC: FTT and right sided weakness  HPI: Grace Summers is an 83 y.o. female with no significant past medical history. Was sent to ED by family member due to complaints of weight loss for the past a few days.  She has decreased appetite and generalized weakness as well. She complains right side weakness for 10 days. But she denies any slurred speech, dysphagia or incontinence.  History reviewed. No pertinent past medical history.  Past Surgical History:  Procedure Laterality Date  . CATARACT EXTRACTION      Family History  Problem Relation Age of Onset  . Breast cancer Mother   . Liver cancer Father     Social History:  reports that she has been smoking Cigarettes.  She has a 30.00 pack-year smoking history. She has never used smokeless tobacco. She reports that she does not drink alcohol or use drugs.  No Known Allergies  Medications:  I have reviewed the patient's current medications. Prior to Admission:  No prescriptions prior to admission.   Scheduled: . cefTRIAXone  1 g Intravenous Q24H  . feeding supplement (ENSURE ENLIVE)  237 mL Oral BID BM  . Influenza vac split quadrivalent PF  0.5 mL Intramuscular Tomorrow-1000  . multivitamin with minerals  1 tablet Oral Daily  . pneumococcal 23 valent vaccine  0.5 mL Intramuscular Tomorrow-1000    ROS: History obtained from the patient  General ROS: as noted in HPI Psychological ROS: negative for - behavioral disorder, hallucinations, memory difficulties, mood swings or suicidal ideation Ophthalmic ROS: negative for - blurry vision, double vision, eye pain or loss of vision ENT ROS: negative for - epistaxis, nasal discharge, oral lesions, sore throat, tinnitus or vertigo Allergy and Immunology ROS: negative for - hives or itchy/watery eyes Hematological and Lymphatic ROS: negative for - bleeding problems, bruising or swollen lymph nodes Endocrine ROS:  negative for - galactorrhea, hair pattern changes, polydipsia/polyuria or temperature intolerance Respiratory ROS: negative for - cough, hemoptysis, shortness of breath or wheezing Cardiovascular ROS: negative for - chest pain, dyspnea on exertion, edema or irregular heartbeat Gastrointestinal ROS: negative for - abdominal pain, diarrhea, hematemesis, nausea/vomiting or stool incontinence Genito-Urinary ROS: negative for - dysuria, hematuria, incontinence or urinary frequency/urgency Musculoskeletal ROS: negative for - joint swelling or muscular weakness Neurological ROS: as noted in HPI Dermatological ROS: negative for rash and skin lesion changes  Physical Examination: Blood pressure (!) 108/48, pulse 90, temperature 98.7 F (37.1 C), temperature source Oral, resp. rate 18, height 5\' 3"  (1.6 m), weight 41.5 kg (91 lb 8 oz), SpO2 100 %.  Gen: cachectic HEENT-  Normocephalic, no lesions, without obvious abnormality.  Normal external eye and conjunctiva.  Normal TM's bilaterally.  Normal auditory canals and external ears. Normal external nose, mucus membranes and septum.  Normal pharynx. Cardiovascular- S1, S2 normal, pulses palpable throughout   Lungs- chest clear, no wheezing, rales, normal symmetric air entry Abdomen- soft, non-tender; bowel sounds normal; no masses,  no organomegaly Extremities- RLE edema Lymph-no adenopathy palpable Musculoskeletal-no joint tenderness, deformity or swelling Skin-warm and dry, no hyperpigmentation, vitiligo, or suspicious lesions  Neurological Examination Mental Status: Alert, oriented, thought content appropriate.  Speech fluent without evidence of aphasia.  Able to follow 3 step commands without difficulty. Cranial Nerves: II: Discs flat bilaterally; Visual fields grossly normal, pupils equal, round, reactive to light and accommodation III,IV, VI: ptosis not present, extra-ocular motions intact bilaterally V,VII: decrease in left NLF, facial light  touch  sensation normal bilaterally VIII: hearing normal bilaterally IX,X: gag reflex present XI: bilateral shoulder shrug XII: midline tongue extension Motor: Patient with generalized weakness but able to lift al extremities against gravity with no focal weakness noted.   Sensory: Pinprick and light touch intact throughout, bilaterally Deep Tendon Reflexes: 2+ in the UE's, 1+ at the knees and absent at the ankles.   Plantars: Right: mute   Left: mute Cerebellar: Normal finger-to-nose and normal heel-to-shin testing bilaterally Gait: not tested due to safety concerns    Laboratory Studies:   Basic Metabolic Panel:  Recent Labs Lab 12/06/16 0926 12/06/16 1421  NA 135  --   K 3.6  --   CL 103  --   CO2 18*  --   GLUCOSE 128*  --   BUN 44*  --   CREATININE 1.50* 1.32*  CALCIUM 8.1*  --     Liver Function Tests:  Recent Labs Lab 12/06/16 1421  AST 159*  ALT 76*  ALKPHOS 431*  BILITOT 11.3*  PROT 6.0*  ALBUMIN 1.5*   No results for input(s): LIPASE, AMYLASE in the last 168 hours. No results for input(s): AMMONIA in the last 168 hours.  CBC:  Recent Labs Lab 12/06/16 0926  WBC 26.2*  NEUTROABS 20.8*  HGB 8.4*  HCT 24.4*  MCV 93.0  PLT 630*    Cardiac Enzymes:  Recent Labs Lab 12/06/16 0926  TROPONINI 0.03*    BNP: Invalid input(s): POCBNP  CBG: No results for input(s): GLUCAP in the last 168 hours.  Microbiology: Results for orders placed or performed during the hospital encounter of 12/06/16  Blood culture (routine x 2)     Status: None (Preliminary result)   Collection Time: 12/06/16 11:32 AM  Result Value Ref Range Status   Specimen Description BLOOD RIGHT FA  Final   Special Requests   Final    BOTTLES DRAWN AEROBIC AND ANAEROBIC AER 11ML ANA 11ML   Culture NO GROWTH < 24 HOURS  Final   Report Status PENDING  Incomplete  Blood culture (routine x 2)     Status: None (Preliminary result)   Collection Time: 12/06/16 11:32 AM  Result  Value Ref Range Status   Specimen Description BLOOD LEFT AC  Final   Special Requests   Final    BOTTLES DRAWN AEROBIC AND ANAEROBIC AER 12ML ANA 15ML   Culture NO GROWTH < 24 HOURS  Final   Report Status PENDING  Incomplete  Urine culture     Status: Abnormal (Preliminary result)   Collection Time: 12/06/16 12:20 PM  Result Value Ref Range Status   Specimen Description URINE, RANDOM  Final   Special Requests NONE  Final   Culture (A)  Final    >=100,000 COLONIES/mL GRAM NEGATIVE RODS CULTURE REINCUBATED FOR BETTER GROWTH Performed at [facility], [address] [place], [place] [address]    Report Status PENDING  Incomplete    Coagulation Studies:  Recent Labs  12/06/16 1421  LABPROT 15.0  INR 1.17    Urinalysis:  Recent Labs Lab 12/06/16 1220  COLORURINE AMBER*  LABSPEC 1.017  PHURINE 5.0  GLUCOSEU NEGATIVE  HGBUR SMALL*  BILIRUBINUR MODERATE*  KETONESUR NEGATIVE  PROTEINUR 30*  NITRITE NEGATIVE  LEUKOCYTESUR LARGE*    Lipid Panel:     Component Value Date/Time   CHOL 157 12/07/2016 0430   TRIG 206 (H) 12/07/2016 0430   HDL <10 (L) 12/07/2016 0430   CHOLHDL NOT CALCULATED 12/07/2016 0430   VLDL  41 (H) 12/07/2016 0430   LDLCALC NOT CALCULATED 12/07/2016 0430    HgbA1C: No results found for: HGBA1C  Urine Drug Screen:  No results found for: LABOPIA, COCAINSCRNUR, LABBENZ, AMPHETMU, THCU, LABBARB  Alcohol Level: No results for input(s): ETH in the last 168 hours.  Other results: EKG: sinus tachycardia at 107 bpm.  Imaging: Dg Chest 1 View  Result Date: 12/06/2016 CLINICAL DATA:  Weight loss EXAM: CHEST 1 VIEW COMPARISON:  None. FINDINGS: There is slight scarring in each upper lobe and right base regions. There is no edema or consolidation. Heart size and pulmonary vascularity are normal. No adenopathy. Calcification is noted in the left carotid artery. IMPRESSION: Scattered areas of mild scarring. No edema or consolidation. There is  calcification, incompletely visualized, in the left carotid artery. Electronically Signed   By: Lowella Grip III M.D.   On: 12/06/2016 10:49   Ct Head Wo Contrast  Result Date: 12/06/2016 CLINICAL DATA:  83 year old female with right lower extremity weakness for 1 week. Initial encounter. EXAM: CT HEAD WITHOUT CONTRAST TECHNIQUE: Contiguous axial images were obtained from the base of the skull through the vertex without intravenous contrast. COMPARISON:  None. FINDINGS: Brain: Extra-axial CSF over the frontal convexities appears related to age related cerebral volume loss. There is patchy hypodensity in the left pons (series 2, image 12). There is relatively little deep gray matter and cerebral white matter hypodensity elsewhere ; there is some involvement of the right basal ganglia. No superimposed cortical encephalomalacia identified. No midline shift, ventriculomegaly, mass effect, evidence of mass lesion, intracranial hemorrhage or evidence of cortically based acute infarction. Vascular: Calcified atherosclerosis at the skull base. No suspicious intracranial vascular hyperdensity. Skull: Osteopenia. Hyperostosis of the calvarium. No acute osseous abnormality identified. Sinuses/Orbits: Clear. Other: No acute orbit or scalp soft tissue finding. IMPRESSION: 1. Age indeterminate hypodensity in the left thalamus is suspicious for an acute to subacute lacunar infarct in this clinical setting. No associated hemorrhage or mass effect. 2. Otherwise suspect relatively mild for age chronic small vessel disease. Electronically Signed   By: Genevie Ann M.D.   On: 12/06/2016 10:39   Mr Brain Wo Contrast  Result Date: 12/06/2016 CLINICAL DATA:  RIGHT-sided weakness for 10 days, decreased appetite and weight loss for a few days. Urinary tract infection. Follow-up stroke. EXAM: MRI HEAD WITHOUT CONTRAST MRA HEAD WITHOUT CONTRAST TECHNIQUE: Multiplanar, multiecho pulse sequences of the brain and surrounding structures  were obtained without intravenous contrast. Angiographic images of the head were obtained using MRA technique without contrast. COMPARISON:  CT HEAD December 06, 2016 1028 hours FINDINGS: MRI HEAD FINDINGS BRAIN: No reduced diffusion to suggest acute ischemia with particular attention to LEFT thalamus. No susceptibility artifact to suggest hemorrhage. The ventricles and sulci are normal for patient's age. 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 A few scattered subcentimeter supratentorial white matter FLAIR T2 hyperintensities compatible with mild chronic small vessel ischemic disease, less than expected for age. No suspicious parenchymal signal, masses or mass effect. No abnormal extra-axial fluid collections. No extra-axial masses though, contrast enhanced sequences would be more sensitive. VASCULAR: Normal major intracranial vascular flow voids present at skull base. SKULL AND UPPER CERVICAL SPINE: No abnormal sellar expansion. No suspicious calvarial bone marrow signal. Craniocervical junction maintained. SINUSES/ORBITS: The mastoid air-cells and included paranasal sinuses are well-aerated. Status post bilateral ocular lens implants. The included ocular globes and orbital contents are non-suspicious. OTHER: Patient is edentulous. MRA HEAD FINDINGS- mildly motion degraded examination. ANTERIOR CIRCULATION: Flow  related enhancement of the included cervical, petrous, cavernous and supraclinoid internal carotid arteries. Moderate stenosis RIGHT anterior genu of the internal carotid artery, Patent anterior communicating artery. Occluded proximal RIGHT A1 segment, minimal distal retrograde flow. Patent anterior communicating artery and bilateral A2 segments and distal ACA's. Flow related enhancement bilateral MCA arteries. Moderate tandem stenoses bilateral MCA, mild luminal irregularity bilateral ACA. POSTERIOR CIRCULATION: RIGHT vertebral artery is dominant. Basilar artery is patent, with  normal flow related enhancement of the main branch vessels. Flow related enhancement of the posterior cerebral arteries. Tiny LEFT posterior communicating artery present. Multifocal moderate stenoses. ANATOMIC VARIANTS: Fetal origin RIGHT posterior cerebral artery. IMPRESSION: MRI HEAD: No acute intracranial process, specifically no acute ischemia. 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 MRA HEAD: No emergent large vessel occlusion on this mildly motion degraded examination. Multifocal anterior and posterior circulation moderate stenoses compatible with atherosclerosis. Chronically occluded proximal RIGHT A1 segment with patent anterior communicating artery. Electronically Signed   By: Elon Alas M.D.   On: 12/06/2016 16:46   US Carotid Bilateral (at [facility] Only)  Result Date: 12/06/2016 CLINICAL DATA:  Right-sided weakness for 10 days EXAM: BILATERAL CAROTID DUPLEX ULTRASOUND TECHNIQUE: Pearline Cables scale imaging, color Doppler and duplex ultrasound were performed of bilateral carotid and vertebral arteries in the neck. COMPARISON:  None. FINDINGS: Criteria: Quantification of carotid stenosis is based on velocity parameters that correlate the residual internal carotid diameter with NASCET-based stenosis levels, using the diameter of the distal internal carotid lumen as the denominator for stenosis measurement. The following velocity measurements were obtained: RIGHT ICA:  101 cm/sec CCA:  88 cm/sec SYSTOLIC ICA/CCA RATIO:  1.1 DIASTOLIC ICA/CCA RATIO:  2.6 ECA:  85 cm/sec LEFT ICA:  94 cm/sec CCA:  93 cm/sec SYSTOLIC ICA/CCA RATIO:  1.0 DIASTOLIC ICA/CCA RATIO:  1.4 ECA:  187 cm/sec RIGHT CAROTID ARTERY: Moderate calcified plaque in the bulb. Low resistance internal carotid Doppler pattern. RIGHT VERTEBRAL ARTERY:  Antegrade. LEFT CAROTID ARTERY: Moderate mixed plaque along the wall of the bulb. Low resistance internal carotid Doppler pattern is  preserved. LEFT VERTEBRAL ARTERY:  Antegrade. IMPRESSION: Less than 50% stenosis in the right and left internal carotid arteries. Electronically Signed   By: Marybelle Killings M.D.   On: 12/06/2016 16:05   Mr Jodene Nam Head/brain [id number] Cm  Result Date: 12/06/2016 CLINICAL DATA:  RIGHT-sided weakness for 10 days, decreased appetite and weight loss for a few days. Urinary tract infection. Follow-up stroke. EXAM: MRI HEAD WITHOUT CONTRAST MRA HEAD WITHOUT CONTRAST TECHNIQUE: Multiplanar, multiecho pulse sequences of the brain and surrounding structures were obtained without intravenous contrast. Angiographic images of the head were obtained using MRA technique without contrast. COMPARISON:  CT HEAD December 06, 2016 1028 hours FINDINGS: MRI HEAD FINDINGS BRAIN: No reduced diffusion to suggest acute ischemia with particular attention to LEFT thalamus. No susceptibility artifact to suggest hemorrhage. The ventricles and sulci are normal for patient's age. 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 A few scattered subcentimeter supratentorial white matter FLAIR T2 hyperintensities compatible with mild chronic small vessel ischemic disease, less than expected for age. No suspicious parenchymal signal, masses or mass effect. No abnormal extra-axial fluid collections. No extra-axial masses though, contrast enhanced sequences would be more sensitive. VASCULAR: Normal major intracranial vascular flow voids present at skull base. SKULL AND UPPER CERVICAL SPINE: No abnormal sellar expansion. No suspicious calvarial bone marrow signal. Craniocervical junction maintained. SINUSES/ORBITS: The mastoid air-cells and included paranasal  sinuses are well-aerated. Status post bilateral ocular lens implants. The included ocular globes and orbital contents are non-suspicious. OTHER: Patient is edentulous. MRA HEAD FINDINGS- mildly motion degraded examination. ANTERIOR CIRCULATION: Flow related enhancement of the included  cervical, petrous, cavernous and supraclinoid internal carotid arteries. Moderate stenosis RIGHT anterior genu of the internal carotid artery, Patent anterior communicating artery. Occluded proximal RIGHT A1 segment, minimal distal retrograde flow. Patent anterior communicating artery and bilateral A2 segments and distal ACA's. Flow related enhancement bilateral MCA arteries. Moderate tandem stenoses bilateral MCA, mild luminal irregularity bilateral ACA. POSTERIOR CIRCULATION: RIGHT vertebral artery is dominant. Basilar artery is patent, with normal flow related enhancement of the main branch vessels. Flow related enhancement of the posterior cerebral arteries. Tiny LEFT posterior communicating artery present. Multifocal moderate stenoses. ANATOMIC VARIANTS: Fetal origin RIGHT posterior cerebral artery. IMPRESSION: MRI HEAD: No acute intracranial process, specifically no acute ischemia. 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 MRA HEAD: No emergent large vessel occlusion on this mildly motion degraded examination. Multifocal anterior and posterior circulation moderate stenoses compatible with atherosclerosis. Chronically occluded proximal RIGHT A1 segment with patent anterior communicating artery. Electronically Signed   By: Elon Alas M.D.   On: 12/06/2016 16:46   US Abdomen Limited Ruq  Result Date: 12/07/2016 CLINICAL DATA:  Jaundice EXAM: US ABDOMEN LIMITED - RIGHT UPPER QUADRANT COMPARISON:  None FINDINGS: Gallbladder: A normal gallbladder is not visualized. Questionable gallbladder filled with shadowing calculi and sludge versus a bowel loop adjacent to the liver. No sonographic Murphy sign. Common bile duct: Diameter: 10 mm diameter, dilated Liver: Intrahepatic biliary dilatation. Nodular margins with heterogeneous parenchymal echogenicity. Multiple hypoechoic masses suspicious for metastatic disease. Largest nodule measures 2.5 x 3.1 x  4.2 cm in the LEFT lobe. Small amounts of scattered ascites throughout abdomen. Due to shadowing from gallstones and bowel gas, unable to identify a discrete mass obstructing the extrahepatic biliary tree at the porta hepatis or pancreatic head. IMPRESSION: A normal appearing gallbladder is not visualized. A structure with shadowing echogenic foci adjacent to the liver edge may represent a gallbladder filled with shadowing calculi and sludge though a bowel loop is not entirely excluded; recommend correlation with surgical history. Nodular liver with multiple hepatic masses suspicious for hepatic metastatic disease. Intrahepatic biliary dilatation and proximal extrahepatic biliary dilatation, though cause of the obstruction is not identified. Further assessment by CT abdomen with contrast or MR imaging with and without contrast recommended to assess liver, extrahepatic biliary tree, and gallbladder. Electronically Signed   By: Lavonia Dana M.D.   On: 12/07/2016 11:45     Assessment/Plan: 83 year old female presenting with failure to thrive and complaints of right sided weakness.  I do not appreciate any significant right sided weakness on evaluation today.  Patient is cachectic though and has multiple metabolic issues that may be catching up to her.  MRI of the brian reviewed and shows no acute changes but does show chronic left sided ischemic disease which may explain her right sided concerns.   Carotid dopplers show no evidence of hemodynamically significant stenosis.  Echocardiogram shows no cardiac source of emboli with an EF of 65-70%.  A1c pending, LDL not calculated.  Recommendations: 1. ASA 81mg  daily 2. No further neurologic intervention is recommended at this time.  If further questions arise, please call or page at that time.  Thank you for allowing neurology to participate in the care of this patient.    Alexis Goodell,  MD Neurology [phone] 12/07/2016, 2:37 PM

## 2016-12-07 NOTE — Evaluation (Signed)
Occupational Therapy Evaluation Patient Details Name: Grace Summers MRN: [id number] DOB: 22-Jul-1934 Today's Date: 12/07/2016    History of Present Illness presented to ER with progressive weakness, LE swelling and R UE weakness x10 days; admitted with sepsis related to UTI.  Head CT, MRI negative for acute change.  Noted with mild jaundice upon presentation; pending abdominal US.   Clinical Impression   Pt is 83 year old female who presents to the ER with progressive weakness, LE swelling and RUE weakness for 10 days. She was admitted with sepsis related to UTI.  Head CT and MRI were negative.   She had an abdominal US to further assess mild jaundice and poor appetite and failure to thrive.  She presents with decreased insight into deficits and has a mild sway when ambulating with FWW and mildly impulsive requiring close supervision and cues.  She is frail appearing and does not have a very good appetite. She has mild weakness in RUE and hand but full AROM and intact sensation. Proprioception and light touch are intact in BUEs as well.  Min assist required to complete LB dressing and close supervision when ambulating to toilet and cues to use FWW.  Pt would benefit from skilled OT services to address ADL training, fine motor skills training, strengthening, and family ed and training.  She lives at home with her sister and niece but her sister works every day until 3pm M-F and her niece works full time.  Pt would benefit from SNF for continued rehab after discharge from hospital.   Follow Up Recommendations  SNF    Equipment Recommendations   (pt would benefit from [facility] over toilet to prevent falls)    Recommendations for Other Services       Precautions / Restrictions Precautions Precautions: Fall Restrictions Weight Bearing Restrictions: No      Mobility Bed Mobility                  Transfers                      Balance                                             ADL Overall ADL's : Needs assistance/impaired Eating/Feeding: Set up;Independent Eating/Feeding Details (indicate cue type and reason): minimal appetite Grooming: Wash/dry hands;Wash/dry face;Set up           Upper Body Dressing : Set up;Minimal assistance   Lower Body Dressing: Set up;Minimal assistance   Toilet Transfer: Min guard;RW;Cueing for safety Toilet Transfer Details (indicate cue type and reason): decreased awareness of deficits with mild sway when ambulating           General ADL Comments: Concerned about safety upon discharge since her sister works every day until 3pm and her niece works full time.  Pt is able to complete most ADLs on her own with cues for safey due to decreased balance and decreased insight which puts her at risk for falls. She is very frial and weak overall with decreased activity tolerance.      Vision     Perception     Praxis      Pertinent Vitals/Pain Pain Assessment: No/denies pain     Hand Dominance Right   Extremity/Trunk Assessment Upper Extremity Assessment Upper Extremity Assessment: Generalized weakness   Lower Extremity  Assessment Lower Extremity Assessment: Defer to PT evaluation       Communication Communication Communication: No difficulties   Cognition Arousal/Alertness: Awake/alert Behavior During Therapy: WFL for tasks assessed/performed Overall Cognitive Status: Within Functional Limits for tasks assessed                     General Comments       Exercises       Shoulder Instructions      Home Living Family/patient expects to be discharged to:: Private residence Living Arrangements: Other relatives (sister and niece) Available Help at Discharge: Family (sister works every day til 3pm and niece works full time) Type of Home: House Home Access: Level entry     [place]: Two level;Able to live on main level with bedroom/bathroom     Bathroom Shower/Tub: Other  (comment) (pt does sponge bath only)         Home Equipment: None          Prior Functioning/Environment Level of Independence: Independent        Comments: Indep with household mobility; denies fall history.        OT Problem List: Decreased strength;Decreased activity tolerance;Decreased safety awareness;Impaired balance (sitting and/or standing)   OT Treatment/Interventions: Self-care/ADL training;Patient/family education;Therapeutic activities;Balance training    OT Goals(Current goals can be found in the care plan section) Acute Rehab OT Goals Patient Stated Goal: to get my strength back again OT Goal Formulation: With patient Time For Goal Achievement: 12/21/16 Potential to Achieve Goals: Good ADL Goals Pt Will Perform Lower Body Dressing: with supervision;sit to/from stand (with no LOB and increased insight into deficits) Pt Will Transfer to Toilet: with min assist;regular height toilet (with no LOB)  OT Frequency: Min 1X/week   Barriers to D/C:            Co-evaluation              End of Session Equipment Utilized During Treatment: Gait belt  Activity Tolerance: Patient limited by fatigue Patient left: in bed;with call bell/phone within reach;with bed alarm set   Time: 1445-1525 OT Time Calculation (min): 40 min Charges:  OT General Charges $OT Visit: 1 Procedure OT Evaluation $OT Eval Moderate Complexity: 1 Procedure OT Treatments $Self Care/Home Management : 8-22 mins $Therapeutic Activity: 8-22 mins G-Codes:    Chrys Racer, OTR/L 12/07/16, 3:44 PM

## 2016-12-07 NOTE — Progress Notes (Signed)
OT Cancellation Note  Patient Details Name: Grace Summers MRN: [id number] DOB: 12/08/1933   Cancelled Treatment:    Reason Eval/Treat Not Completed: Patient at procedure or test/ unavailable.  Order received and chart reviewed.  Pt off floor for abdominal US.  Will attempt again later.  Thank you for the referral.  Chrys Racer, OTR/L ascom [phone] 12/07/16, 10:53 AM

## 2016-12-07 NOTE — Plan of Care (Signed)
Problem: Physical Regulation: Goal: Ability to maintain clinical measurements within normal limits will improve Outcome: Not Progressing T 100.2 0200 this AM.  Recheck pending.  Problem: Fluid Volume: Goal: Ability to maintain a balanced intake and output will improve Outcome: Not Applicable Date Met: 05/69/79 NPO for ultrasound abd this AM  Problem: Bowel/Gastric: Goal: Will not experience complications related to bowel motility Outcome: Progressing BM this shift

## 2016-12-07 NOTE — Evaluation (Addendum)
Physical Therapy Evaluation Patient Details Name: Grace Summers MRN: [id number] DOB: [birth year] Today's Date: 12/07/2016   History of Present Illness  presented to ER with progressive weakness, LE swelling and R UE weakness x10 days; admitted with sepsis related to UTI.  Head CT, MRI negative for acute change.  Noted with mild jaundice upon presentation; pending abdominal US.  Clinical Impression  Upon evaluation, patient alert and oriented to basic information; follows simple commands.  Intermittently confused; noted deficits in problem-solving, safety awareness and insight into deficits.  Mild weakness and questionable sensory deficit noted R hemi-body (LE > UE), though difficulty to fully assess due to inconsistent patient response.  Despite mild weakness, patient able to complete bed mobility with mod indep; sit/stand, basic transfers and gait (210') with RW, cga/min assist.  Mild sway and higher-level balance deficits noted (as evidenced by limited functional reach, decreased gait speed, limited insight to deficits); do recommend continued use of RW and +1 sup/assist for all mobility at this time.  Unsafe to be home alone for any period of time currently. Would benefit from skilled PT to address above deficits and promote optimal return to PLOF; Recommend transition to STR upon discharge from acute hospitalization.     Follow Up Recommendations SNF    Equipment Recommendations  Rolling walker with 5" wheels    Recommendations for Other Services       Precautions / Restrictions Precautions Precautions: Fall Restrictions Weight Bearing Restrictions: No      Mobility  Bed Mobility Overal bed mobility: Modified Independent             General bed mobility comments: transition towards L  Transfers Overall transfer level: Needs assistance Equipment used: Rolling walker (2 wheeled) Transfers: Sit to/from Stand Sit to Stand: Min guard         General transfer comment:  mild impulsivity, decreased power noted bilat LEs  Ambulation/Gait Ambulation/Gait assistance: Min guard;Min assist Ambulation Distance (Feet): 210 Feet Assistive device: Rolling walker (2 wheeled)     Gait velocity interpretation: <1.8 ft/sec, indicative of risk for recurrent falls General Gait Details: reciprocal stepping pattern with narrowed BOS, intermittent (mild) scissoring with head turns and changes of direction.  Mild R lateral sway, but self-corrects without significant LOB.  Do recommend continued use of RW for all mobility efforts at this time.    Stairs            Wheelchair Mobility    Modified Rankin (Stroke Patients Only)       Balance Overall balance assessment: Needs assistance Sitting-balance support: No upper extremity supported;Feet supported Sitting balance-Leahy Scale: Good     Standing balance support: Single extremity supported Standing balance-Leahy Scale: Fair Standing balance comment: standing functional reach approx 3-4" from immediate BOS                             Pertinent Vitals/Pain Pain Assessment: No/denies pain    Home Living Family/patient expects to be discharged to:: Private residence Living Arrangements: Other relatives (sister) Available Help at Discharge: Family Type of Home: House Home Access: Level entry     Home Layout: Two level;Able to live on main level with bedroom/bathroom Home Equipment: None      Prior Function Level of Independence: Independent         Comments: Indep with household mobility; denies fall history.     Hand Dominance        Extremity/Trunk Assessment  Upper Extremity Assessment Upper Extremity Assessment:  (bilat UE strength/ROM grossly symmetrical and WFL, at least 3+/5; questionable sensory deficit in R UE (inconsistent responses))    Lower Extremity Assessment Lower Extremity Assessment:  (L LE grossly at least 4-/5, R LE at least 3-/5 (patient subjectively  reporting increased difficulty with R LE); questionable sensory deficit throughout R LE (inconsistent response))       Communication   Communication: No difficulties  Cognition Arousal/Alertness: Awake/alert Behavior During Therapy: WFL for tasks assessed/performed Overall Cognitive Status: Within Functional Limits for tasks assessed (overally response time slightly delayed)                      General Comments      Exercises     Assessment/Plan    PT Assessment Patient needs continued PT services  PT Problem List Decreased strength;Decreased range of motion;Decreased activity tolerance;Decreased balance;Decreased mobility;Decreased coordination;Decreased cognition;Decreased knowledge of use of DME;Decreased safety awareness          PT Treatment Interventions DME instruction;Gait training;Stair training;Functional mobility training;Therapeutic activities;Therapeutic exercise;Balance training;Patient/family education    PT Goals (Current goals can be found in the Care Plan section)  Acute Rehab PT Goals Patient Stated Goal: to return home PT Goal Formulation: With patient Time For Goal Achievement: 12/21/16 Potential to Achieve Goals: Good    Frequency Min 2X/week   Barriers to discharge Decreased caregiver support      Co-evaluation               End of Session Equipment Utilized During Treatment: Gait belt Activity Tolerance: Patient tolerated treatment well Patient left: in chair;with call bell/phone within reach;with chair alarm set Nurse Communication: Mobility status         Time: [id number] PT Time Calculation (min) (ACUTE ONLY): 23 min   Charges:   PT Evaluation $PT Eval Low Complexity: 1 Procedure     PT G Codes:        **Treatment session lead, directed by [employer], SPT; documentation completed concurrently by Tera Helper, Rodney. Owens Shark, PT, DPT, NCS 12/07/16, 10:19 AM [phone]

## 2016-12-08 LAB — CBC
HEMATOCRIT: 20.4 % — AB (ref 35.0–47.0)
HEMATOCRIT: 25.6 % — AB (ref 35.0–47.0)
HEMOGLOBIN: 6.7 g/dL — AB (ref 12.0–16.0)
Hemoglobin: 8.7 g/dL — ABNORMAL LOW (ref 12.0–16.0)
MCH: 30.5 pg (ref 26.0–34.0)
MCH: 30.7 pg (ref 26.0–34.0)
MCHC: 33 g/dL (ref 32.0–36.0)
MCHC: 33.9 g/dL (ref 32.0–36.0)
MCV: 92.3 fL (ref 80.0–100.0)
MCV: 90.4 fL (ref 80.0–100.0)
PLATELETS: 409 10*3/uL (ref 150–440)
Platelets: 427 10*3/uL (ref 150–440)
RBC: 2.21 MIL/uL — ABNORMAL LOW (ref 3.80–5.20)
RBC: 2.83 MIL/uL — AB (ref 3.80–5.20)
RDW: 17.8 % — ABNORMAL HIGH (ref 11.5–14.5)
RDW: 22.1 % — ABNORMAL HIGH (ref 11.5–14.5)
WBC: 20.6 10*3/uL — ABNORMAL HIGH (ref 3.6–11.0)
WBC: 21.2 10*3/uL — AB (ref 3.6–11.0)

## 2016-12-08 LAB — BASIC METABOLIC PANEL
Anion gap: 7 (ref 5–15)
BUN: 35 mg/dL — ABNORMAL HIGH (ref 6–20)
CHLORIDE: 110 mmol/L (ref 101–111)
CO2: 19 mmol/L — AB (ref 22–32)
CREATININE: 1.06 mg/dL — AB (ref 0.44–1.00)
Calcium: 7.4 mg/dL — ABNORMAL LOW (ref 8.9–10.3)
GFR calc non Af Amer: 48 mL/min — ABNORMAL LOW (ref >60)
GFR, EST AFRICAN AMERICAN: 56 mL/min — AB (ref >60)
Glucose, Bld: 68 mg/dL (ref 65–99)
POTASSIUM: 3.5 mmol/L (ref 3.5–5.1)
Sodium: 136 mmol/L (ref 135–145)

## 2016-12-08 LAB — PREPARE RBC (CROSSMATCH)

## 2016-12-08 LAB — HEMOGLOBIN A1C: Hgb A1c MFr Bld: <4.2 % — ABNORMAL LOW (ref 4.8–5.6)

## 2016-12-08 LAB — ABO/RH: ABO/RH(D): O POS

## 2016-12-08 MED ORDER — SODIUM CHLORIDE 0.9 % IV SOLN
Freq: Once | INTRAVENOUS | Status: AC
  Administered 2016-12-08: 15:00:00 via INTRAVENOUS

## 2016-12-08 NOTE — Progress Notes (Signed)
PT Cancellation Note  Patient Details Name: Grace Summers MRN: [id number] DOB: December 04, 1933   Cancelled Treatment:    Reason Eval/Treat Not Completed: Medical issues which prohibited therapy (Pt Hgb 6.7 and HCT [id number] are low; per policy is contraindicated for PT treatment. Has orders for Blood transfusions 12/08/16)   Silvester Reierson Student PT 12/08/2016, 2:21 PM

## 2016-12-08 NOTE — Progress Notes (Signed)
OT Cancellation Note  Patient Details Name: Constanza Menear MRN: [id number] DOB: 1934-01-21   Cancelled Treatment:    Reason Eval/Treat Not Completed: Medical issues which prohibited therapy (Pt. with low hemoglobin of 6.7, and low HCT of 20.4. OT services are contraindicated at this time. Will continue to monitor and treat when appropriate.)  Harrel Carina, MS, OTR/L 12/08/2016, 9:52 AM

## 2016-12-08 NOTE — Plan of Care (Addendum)
Problem: Physical Regulation: Goal: Ability to maintain clinical measurements within normal limits will improve Outcome: Progressing In am Hgb 6.7. 1xunit of RBC's initiated. No transfusion reaction noted. Placed an order to recheck Hgb post transfusion. No c/o n/v. Tolerates full liquid. NPO after midnight for MRCP.  Problem: Bowel/Gastric: Goal: Will not experience complications related to bowel motility Outcome: Progressing 1xsoft brown stool during the shift. No blood noted.

## 2016-12-08 NOTE — Progress Notes (Signed)
[facility] [place] NAME: Grace Summers    MR#:  [id number]  DATE OF BIRTH:  07-Feb-1934  SUBJECTIVE:  Waiting for MRCP today, ate Ice-cream so couldn't get MRCP earlier today. Hb dropped to 6.7 REVIEW OF SYSTEMS:   Review of Systems  Constitutional: Negative for chills, fever and weight loss.  HENT: Negative for ear discharge, ear pain and nosebleeds.   Eyes: Negative for blurred vision, pain and discharge.  Respiratory: Negative for sputum production, shortness of breath, wheezing and stridor.   Cardiovascular: Negative for chest pain, palpitations, orthopnea and PND.  Gastrointestinal: Negative for abdominal pain, diarrhea, nausea and vomiting.  Genitourinary: Negative for frequency and urgency.  Musculoskeletal: Positive for joint pain. Negative for back pain.  Neurological: Positive for weakness. Negative for sensory change, speech change and focal weakness.  Psychiatric/Behavioral: Negative for depression and hallucinations. The patient is not nervous/anxious.    Tolerating Diet:some Tolerating PT: SNF DRUG ALLERGIES:  No Known Allergies VITALS:  Blood pressure (!) 127/55, pulse 91, temperature 98.7 F (37.1 C), resp. rate 18, height 5\' 3"  (1.6 m), weight 41.5 kg (91 lb 8 oz), SpO2 100 %. PHYSICAL EXAMINATION:  Physical Exam  GENERAL:  83 y.o.-year-old patient lying in the bed with no acute distress. Weak, cachectic EYES: Pupils equal, round, reactive to light and accommodation. ++ scleral icterus. Extraocular muscles intact.  HEENT: Head atraumatic, normocephalic. Oropharynx and nasopharynx clear.  NECK:  Supple, no jugular venous distention. No thyroid enlargement, no tenderness.  LUNGS: Normal breath sounds bilaterally, no wheezing, rales, rhonchi. No use of accessory muscles of respiration.  CARDIOVASCULAR: S1, S2 normal. No murmurs, rubs, or gallops.  ABDOMEN: Soft, nontender, nondistended. Bowel sounds present. No  organomegaly or mass.  EXTREMITIES: No cyanosis, clubbing or edema b/l.    NEUROLOGIC grossly non focal PSYCHIATRIC:  patient is alert and pleasantly confused SKIN: No obvious rash, lesion, or ulcer. LABORATORY PANEL:  CBC  Recent Labs Lab 12/08/16 0824  WBC 20.6*  HGB 6.7*  HCT 20.4*  PLT 427    Chemistries   Recent Labs Lab 12/06/16 1421 12/08/16 0824  NA  --  136  K  --  3.5  CL  --  110  CO2  --  19*  GLUCOSE  --  68  BUN  --  35*  CREATININE 1.32* 1.06*  CALCIUM  --  7.4*  AST 159*  --   ALT 76*  --   ALKPHOS 431*  --   BILITOT 11.3*  --    Cardiac Enzymes  Recent Labs Lab 12/06/16 0926  TROPONINI 0.03*   RADIOLOGY:  Mr Brain Wo Contrast  Result Date: 12/06/2016 CLINICAL DATA:  RIGHT-sided weakness for 10 days, decreased appetite and weight loss for a few days. Urinary tract infection. Follow-up stroke. EXAM: MRI HEAD WITHOUT CONTRAST MRA HEAD WITHOUT CONTRAST TECHNIQUE: Multiplanar, multiecho pulse sequences of the brain and surrounding structures were obtained without intravenous contrast. Angiographic images of the head were obtained using MRA technique without contrast. COMPARISON:  CT HEAD December 06, 2016 1028 hours FINDINGS: MRI HEAD FINDINGS BRAIN: No reduced diffusion to suggest acute ischemia with particular attention to LEFT thalamus. No susceptibility artifact to suggest hemorrhage. The ventricles and sulci are normal for patient's age. 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 A few scattered subcentimeter supratentorial white matter FLAIR T2 hyperintensities compatible with mild chronic small vessel ischemic disease, less than expected for age. No suspicious parenchymal signal, masses  or mass effect. No abnormal extra-axial fluid collections. No extra-axial masses though, contrast enhanced sequences would be more sensitive. VASCULAR: Normal major intracranial vascular flow voids present at skull base. SKULL AND UPPER CERVICAL  SPINE: No abnormal sellar expansion. No suspicious calvarial bone marrow signal. Craniocervical junction maintained. SINUSES/ORBITS: The mastoid air-cells and included paranasal sinuses are well-aerated. Status post bilateral ocular lens implants. The included ocular globes and orbital contents are non-suspicious. OTHER: Patient is edentulous. MRA HEAD FINDINGS- mildly motion degraded examination. ANTERIOR CIRCULATION: Flow related enhancement of the included cervical, petrous, cavernous and supraclinoid internal carotid arteries. Moderate stenosis RIGHT anterior genu of the internal carotid artery, Patent anterior communicating artery. Occluded proximal RIGHT A1 segment, minimal distal retrograde flow. Patent anterior communicating artery and bilateral A2 segments and distal ACA's. Flow related enhancement bilateral MCA arteries. Moderate tandem stenoses bilateral MCA, mild luminal irregularity bilateral ACA. POSTERIOR CIRCULATION: RIGHT vertebral artery is dominant. Basilar artery is patent, with normal flow related enhancement of the main branch vessels. Flow related enhancement of the posterior cerebral arteries. Tiny LEFT posterior communicating artery present. Multifocal moderate stenoses. ANATOMIC VARIANTS: Fetal origin RIGHT posterior cerebral artery. IMPRESSION: MRI HEAD: No acute intracranial process, specifically no acute ischemia. 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 MRA HEAD: No emergent large vessel occlusion on this mildly motion degraded examination. Multifocal anterior and posterior circulation moderate stenoses compatible with atherosclerosis. Chronically occluded proximal RIGHT A1 segment with patent anterior communicating artery. Electronically Signed   By: Elon Alas M.D.   On: 12/06/2016 16:46   US Carotid Bilateral (at [facility] Only)  Result Date: 12/06/2016 CLINICAL DATA:  Right-sided weakness for 10 days EXAM:  BILATERAL CAROTID DUPLEX ULTRASOUND TECHNIQUE: Pearline Cables scale imaging, color Doppler and duplex ultrasound were performed of bilateral carotid and vertebral arteries in the neck. COMPARISON:  None. FINDINGS: Criteria: Quantification of carotid stenosis is based on velocity parameters that correlate the residual internal carotid diameter with NASCET-based stenosis levels, using the diameter of the distal internal carotid lumen as the denominator for stenosis measurement. The following velocity measurements were obtained: RIGHT ICA:  101 cm/sec CCA:  88 cm/sec SYSTOLIC ICA/CCA RATIO:  1.1 DIASTOLIC ICA/CCA RATIO:  2.6 ECA:  85 cm/sec LEFT ICA:  94 cm/sec CCA:  93 cm/sec SYSTOLIC ICA/CCA RATIO:  1.0 DIASTOLIC ICA/CCA RATIO:  1.4 ECA:  187 cm/sec RIGHT CAROTID ARTERY: Moderate calcified plaque in the bulb. Low resistance internal carotid Doppler pattern. RIGHT VERTEBRAL ARTERY:  Antegrade. LEFT CAROTID ARTERY: Moderate mixed plaque along the wall of the bulb. Low resistance internal carotid Doppler pattern is preserved. LEFT VERTEBRAL ARTERY:  Antegrade. IMPRESSION: Less than 50% stenosis in the right and left internal carotid arteries. Electronically Signed   By: Marybelle Killings M.D.   On: 12/06/2016 16:05   Mr Jodene Nam Head/brain [id number] Cm  Result Date: 12/06/2016 CLINICAL DATA:  RIGHT-sided weakness for 10 days, decreased appetite and weight loss for a few days. Urinary tract infection. Follow-up stroke. EXAM: MRI HEAD WITHOUT CONTRAST MRA HEAD WITHOUT CONTRAST TECHNIQUE: Multiplanar, multiecho pulse sequences of the brain and surrounding structures were obtained without intravenous contrast. Angiographic images of the head were obtained using MRA technique without contrast. COMPARISON:  CT HEAD December 06, 2016 1028 hours FINDINGS: MRI HEAD FINDINGS BRAIN: No reduced diffusion to suggest acute ischemia with particular attention to LEFT thalamus. No susceptibility artifact to suggest hemorrhage. The ventricles and sulci are  normal for patient's age. Old LEFT thalamus and  bilateral basal ganglia lacunar infarcts. A few scattered subcentimeter supratentorial white matter FLAIR T2 hyperintensities compatible with mild chronic small vessel ischemic disease, less than expected for age. No suspicious parenchymal signal, masses or mass effect. No abnormal extra-axial fluid collections. No extra-axial masses though, contrast enhanced sequences would be more sensitive. VASCULAR: Normal major intracranial vascular flow voids present at skull base. SKULL AND UPPER CERVICAL SPINE: No abnormal sellar expansion. No suspicious calvarial bone marrow signal. Craniocervical junction maintained. SINUSES/ORBITS: The mastoid air-cells and included paranasal sinuses are well-aerated. Status post bilateral ocular lens implants. The included ocular globes and orbital contents are non-suspicious. OTHER: Patient is edentulous. MRA HEAD FINDINGS- mildly motion degraded examination. ANTERIOR CIRCULATION: Flow related enhancement of the included cervical, petrous, cavernous and supraclinoid internal carotid arteries. Moderate stenosis RIGHT anterior genu of the internal carotid artery, Patent anterior communicating artery. Occluded proximal RIGHT A1 segment, minimal distal retrograde flow. Patent anterior communicating artery and bilateral A2 segments and distal ACA's. Flow related enhancement bilateral MCA arteries. Moderate tandem stenoses bilateral MCA, mild luminal irregularity bilateral ACA. POSTERIOR CIRCULATION: RIGHT vertebral artery is dominant. Basilar artery is patent, with normal flow related enhancement of the main branch vessels. Flow related enhancement of the posterior cerebral arteries. Tiny LEFT posterior communicating artery present. Multifocal moderate stenoses. ANATOMIC VARIANTS: Fetal origin RIGHT posterior cerebral artery. IMPRESSION: MRI HEAD: No acute intracranial process, specifically no acute ischemia. Old LEFT thalamus and bilateral  basal ganglia lacunar infarcts; otherwise negative MRI head for age. MRA HEAD: No emergent large vessel occlusion on this mildly motion degraded examination. Multifocal anterior and posterior circulation moderate stenoses compatible with atherosclerosis. Chronically occluded proximal RIGHT A1 segment with patent anterior communicating artery. Electronically Signed   By: Elon Alas M.D.   On: 12/06/2016 16:46   US Abdomen Limited Ruq  Result Date: 12/07/2016 CLINICAL DATA:  Jaundice EXAM: US ABDOMEN LIMITED - RIGHT UPPER QUADRANT COMPARISON:  None FINDINGS: Gallbladder: A normal gallbladder is not visualized. Questionable gallbladder filled with shadowing calculi and sludge versus a bowel loop adjacent to the liver. No sonographic Murphy sign. Common bile duct: Diameter: 10 mm diameter, dilated Liver: Intrahepatic biliary dilatation. Nodular margins with heterogeneous parenchymal echogenicity. Multiple hypoechoic masses suspicious for metastatic disease. Largest nodule measures 2.5 x 3.1 x 4.2 cm in the LEFT lobe. Small amounts of scattered ascites throughout abdomen. Due to shadowing from gallstones and bowel gas, unable to identify a discrete mass obstructing the extrahepatic biliary tree at the porta hepatis or pancreatic head. IMPRESSION: A normal appearing gallbladder is not visualized. A structure with shadowing echogenic foci adjacent to the liver edge may represent a gallbladder filled with shadowing calculi and sludge though a bowel loop is not entirely excluded; recommend correlation with surgical history. Nodular liver with multiple hepatic masses suspicious for hepatic metastatic disease. Intrahepatic biliary dilatation and proximal extrahepatic biliary dilatation, though cause of the obstruction is not identified. Further assessment by CT abdomen with contrast or MR imaging with and without contrast recommended to assess liver, extrahepatic biliary tree, and gallbladder. Electronically Signed    By: Lavonia Dana M.D.   On: 12/07/2016 11:45   ASSESSMENT AND PLAN:  Chyna Hoeg  is a 83 y.o. female withNo past medical history. Was sent to ED by family member due to above chief complaints. The patient has had significant weight loss for the past a few days, he has decreased appetite and generalized weakness. She complains right side weakness for 10 days. But she denies any slurred speech, dysphagia  or incontinence. She denies any fever or chills, no cough, phlegm or shortness of breath  * Sepsis due to E.coli UTI. - Continue Rocephin, follow-up CBC and cultures.  *Jaundice, follow-up abdominal ultrasound and liver function test. -obstructive -USG shows Nodular liver with multiple hepatic masses suspicious for hepatic metastatic disease -MRCP cancelled as she ate ice-cream - GI consult couldn't be done as no GI coverage today/tomorrow - coverage starts tomorrow at 7 pm -oncology consult after MRCP -c/s palliative care  *right sided weakness -w/u for stroke Negative -old cva on CT head  *GI bleed: ?upper -dark marron blood clots -d/c asa -GI consult couldn't be done as no GI coverage today/tomorrow - coverage starts tomorrow at 7 pm  *Acute renal failure due to dehydration. Start IV fluid support and follow-up BMP.  * Lactic acidosis, treatment as above, follow-up lactic acid level.  * Failure to thrive. Dietitian consult. May need workup for malignancy.  * Acute blood loss Anemia. Possible due to chronic disease and malnutrition.  *Tobacco abuse. Smoking cessation was counseled for more than 4 minutes, nicotine patch.    Case discussed with Care Management/Social Worker. Management plans discussed with the patient, family (d/w daughter [name] via phone [phone]) and they are in agreement.  CODE STATUS: DNR  DVT Prophylaxis: SCD  TOTAL TIME TAKING CARE OF THIS PATIENT: 30 minutes.   >50% time spent on counselling and coordination of care  POSSIBLE D/C  IN 2-3 DAYS, DEPENDING ON CLINICAL CONDITION. And GI eval  Note: This dictation was prepared with Dragon dictation along with smaller phrase technology. Any transcriptional errors that result from this process are unintentional.  Max Sane M.D on 12/08/2016 at 1:52 PM  Between 7am to 6pm - Pager - [phone]  After 6pm go to [email or web address] - password [email or web address] [facility]  [facility]  Office  [phone]  CC: Primary care physician; No PCP Per Patient

## 2016-12-08 NOTE — Care Management Important Message (Signed)
Important Message  Patient Details  Name: Grace Summers MRN: [id number] Date of Birth: 1934-08-17   Medicare Important Message Given:  Yes    Shelbie Ammons, RN 12/08/2016, 8:39 AM

## 2016-12-08 NOTE — NC FL2 (Signed)
[place] LEVEL OF CARE SCREENING TOOL     IDENTIFICATION  Patient Name: Grace Summers Birthdate: 10-31-1934 Sex: female Admission Date (Current Location): 12/06/2016  [place] and [place] Number:  Engineering geologist and Address:  [facility], [address], [place], [place] [address]      Provider Number: [id number]  Attending Physician Name and Address:  Max Sane, MD  Relative Name and Phone Number:  [name] Daughter [phone]     Current Level of Care: Hospital Recommended Level of Care: [place] Prior Approval Number:    Date Approved/Denied:   PASRR Number: [id number]  Discharge Plan: SNF    Current Diagnoses: Patient Active Problem List   Diagnosis Date Noted  . Pressure injury of skin 12/07/2016  . Sepsis ([place]) 12/06/2016    Orientation RESPIRATION BLADDER Height & Weight     Self, Time, Situation, Place  Normal Incontinent Weight: 91 lb 8 oz (41.5 kg) Height:  5\' 3"  (160 cm)  BEHAVIORAL SYMPTOMS/MOOD NEUROLOGICAL BOWEL NUTRITION STATUS      Incontinent Diet (Liquid diet)  AMBULATORY STATUS COMMUNICATION OF NEEDS Skin   Limited Assist Verbally PU Stage and Appropriate Care   PU Stage 2 Dressing:  (Change dressing every three days)                   Personal Care Assistance Level of Assistance  Bathing, Feeding, Dressing Bathing Assistance: Limited assistance Feeding assistance: Limited assistance Dressing Assistance: Limited assistance     Functional Limitations Info  Sight, Hearing, Speech Sight Info: Adequate Hearing Info: Adequate Speech Info: Adequate    SPECIAL CARE FACTORS FREQUENCY  PT (By licensed PT), OT (By licensed OT), Speech therapy     PT Frequency: 5x a week OT Frequency: 5x a wek     Speech Therapy Frequency: 3x a week      Contractures Contractures Info: Not present    Additional Factors Info  Code Status, Allergies Code Status Info: Full  Code Allergies Info: NKA           Current Medications (12/08/2016):  This is the current hospital active medication list Current Facility-Administered Medications  Medication Dose Route Frequency Provider Last Rate Last Dose  . 0.9 %  sodium chloride infusion   Intravenous Continuous Demetrios Loll, MD 75 mL/hr at 12/08/16 0500    . acetaminophen (TYLENOL) tablet 650 mg  650 mg Oral Q4H PRN Demetrios Loll, MD   650 mg at 12/07/16 0206   Or  . acetaminophen (TYLENOL) solution 650 mg  650 mg Per Tube Q4H PRN Demetrios Loll, MD       Or  . acetaminophen (TYLENOL) suppository 650 mg  650 mg Rectal Q4H PRN Demetrios Loll, MD      . cefTRIAXone (ROCEPHIN) IVPB 1 g  1 g Intravenous Q24H Lenis Noon, RPH   1 g at 12/07/16 1211  . feeding supplement (ENSURE ENLIVE) (ENSURE ENLIVE) liquid 237 mL  237 mL Oral BID BM [name], MD   237 mL at 12/07/16 1500  . Influenza vac split quadrivalent PF (FLUARIX) injection 0.5 mL  0.5 mL Intramuscular Tomorrow-1000 Demetrios Loll, MD      . MEDLINE mouth rinse  15 mL Mouth Rinse BID [name], MD   15 mL at 12/07/16 2200  . multivitamin with minerals tablet 1 tablet  1 tablet Oral Daily [name], MD   1 tablet at 12/07/16 1453  . pantoprazole (PROTONIX) EC tablet 40 mg  40 mg Oral Daily [name], MD   40 mg at 12/07/16 1612  . pneumococcal 23 valent vaccine (PNU-IMMUNE) injection 0.5 mL  0.5 mL Intramuscular Tomorrow-1000 Demetrios Loll, MD      . senna-docusate (Senokot-S) tablet 1 tablet  1 tablet Oral QHS PRN Demetrios Loll, MD         Discharge Medications: Please see discharge summary for a list of discharge medications.  Relevant Imaging Results:  Relevant Lab Results:   Additional Information SSN [id number]  Ross Ludwig, [place]

## 2016-12-08 NOTE — Progress Notes (Signed)
   I spoke to daughter introducing Palliative Medicine  and its role in a patient centered treatment plan and offered to meet to discuss GOCs.   I offered anytime tomorrow between 7:30am  and 4:00pm.  She was unable to make that time work.  I suggested sometime on Monday if her mother was still IP.  She is going to think about and call our team phone if she is interested in meeting with PMT.  No charge  Wadie Lessen NP  Palliative Medicine Team Team Phone # [phone] Pager [phone]

## 2016-12-08 NOTE — Progress Notes (Signed)
Family Meeting Note  Advance Directive:no  Today a meeting took place with the Patient.  The following clinical team members were present during this meeting:MD  The following were discussed:Patient's diagnosis: , Patient's progosis: > 12 months and Goals for treatment: DNR  Additional follow-up to be provided: Palliative care eval - d/w patient's daughter who is in agreement.  Time spent during discussion:20 minutes  Max Sane, MD

## 2016-12-09 ENCOUNTER — Inpatient Hospital Stay: Payer: Medicare Other

## 2016-12-09 DIAGNOSIS — R627 Adult failure to thrive: Secondary | ICD-10-CM

## 2016-12-09 DIAGNOSIS — Z66 Do not resuscitate: Secondary | ICD-10-CM

## 2016-12-09 DIAGNOSIS — M6281 Muscle weakness (generalized): Secondary | ICD-10-CM

## 2016-12-09 DIAGNOSIS — Z515 Encounter for palliative care: Secondary | ICD-10-CM

## 2016-12-09 DIAGNOSIS — Z7189 Other specified counseling: Secondary | ICD-10-CM

## 2016-12-09 DIAGNOSIS — R160 Hepatomegaly, not elsewhere classified: Secondary | ICD-10-CM

## 2016-12-09 LAB — URINE CULTURE

## 2016-12-09 MED ORDER — LEVOFLOXACIN IN D5W 250 MG/50ML IV SOLN
250.0000 mg | INTRAVENOUS | Status: DC
  Administered 2016-12-09 – 2016-12-10 (×2): 250 mg via INTRAVENOUS
  Filled 2016-12-09 (×2): qty 50

## 2016-12-09 MED ORDER — GADOBENATE DIMEGLUMINE 529 MG/ML IV SOLN
10.0000 mL | Freq: Once | INTRAVENOUS | Status: AC | PRN
  Administered 2016-12-09: 8 mL via INTRAVENOUS

## 2016-12-09 NOTE — Progress Notes (Signed)
Occupational Therapy Treatment Patient Details Name: Grace Summers MRN: [id number] DOB: 1934/05/29 Today's Date: 12/09/2016    History of present illness presented to ER with progressive weakness, LE swelling and R UE weakness x10 days; admitted with sepsis related to UTI.  Head CT, MRI negative for acute change.  Noted with mild jaundice upon presentation; pending abdominal US.   OT comments  Pt fat[facility]ued from [facility] test today with new results that her daughter was updating this therapist about and asking questions about home care if she is able to return to home.  Pt declined sitting up at EOB due to fatigue but participated in discussion about how to encourage patient to do as much for herself as possible and consider use of BSC over toilet and shower chair if patient returns home after SNF to prevent falls.  Also discussed other AD to assist with ADLs.  Family asking good questions.     Follow Up Recommendations  SNF    Equipment Recommendations       Recommendations for Other Services      Precautions / Restrictions Precautions Precautions: Fall Restrictions Weight Bearing Restrictions: No       Mobility Bed Mobility                  Transfers                      Balance                                   ADL Overall ADL's : Needs assistance/impaired                                       General ADL Comments: Spoke to pt, her daughter, sister and niece about update about MCRP test that indicated a mass in her abdomen obstructing bile ducts, a spot on her liver and MD is not rec surgery due to her age.  Pt is to DC to SNF closer to family, not [facility] and discussed safe set up at home for pt if she eventually goes home, not clear based on new test results.  Family asked good questions.      Vision                     Perception     Praxis      Cognition                              Extremity/Trunk Assessment               Exercises     Shoulder Instructions       General Comments      Pertinent Vitals/ Pain       Pain Assessment: No/denies pain  Home Living                                          Prior Functioning/Environment              Frequency  Min 1X/week        Progress Toward Goals  OT Goals(current goals can now be found in  the care plan section)  Progress towards OT goals: Progressing toward goals     Plan Discharge plan remains appropriate    Co-evaluation                 End of Session     Activity Tolerance Patient limited by fatigue   Patient Left in bed;with call bell/phone within reach;with bed alarm set;with family/visitor present   Nurse Communication          Time: [id number] OT Time Calculation (min): 30 min  Charges: OT General Charges $OT Visit: 1 Procedure OT Treatments $Self Care/Home Management : 23-37 mins  Chrys Racer, OTR/L 12/09/16, 4:53 PM

## 2016-12-09 NOTE — Clinical Social Work Note (Signed)
CSW was informed that patient's daughter would like patient to go to [facility] in [place] for short term rehab.  CSW contacted [facility] who said they can accept patient when she is medically ready for discharge and orders have been received.  CSW will continue to follow patient's progress throughout discharge planning.  Jones Broom. [place], MSW, [place]  12/09/2016 9:52 AM

## 2016-12-09 NOTE — Progress Notes (Signed)
Nutrition Follow-up  DOCUMENTATION CODES:   Severe malnutrition in context of chronic illness, Underweight  INTERVENTION:  Monitor magnesium, potassium, and phosphorus daily for at least 3 days, MD to replete as needed, as pt is at risk for refeeding syndrome given severe malnutrition.  Continue Ensure Enlive po BID, each supplement provides 350 kcal and 20 grams of protein. Encouraged patient to try to drink both daily by sipping slowly.  Continue multivitamin with minerals daily.  Will continue to monitor for discussions regarding goals of care.   NUTRITION DIAGNOSIS:   Malnutrition (Severe) related to chronic illness as evidenced by percent weight loss, severe depletion of body fat, severe depletion of muscle mass.  Ongoing.  GOAL:   Patient will meet greater than or equal to 90% of their needs  Not met.   MONITOR:   PO intake, Supplement acceptance, Labs, I & O's, Weight trends  REASON FOR ASSESSMENT:   Malnutrition Screening Tool, Consult Assessment of nutrition requirement/status  ASSESSMENT:   83 year old female with no past medical history who presents with significant weight loss, decreased appetite, and generalized weakness. Patient found to have sepsis with UTI, acute or subacute CVA, acute renal failure due to dehydration, jaundice.   -Palliative Medicine has reached out to family for meeting but unable to meet. May have meeting on Monday. -Per chart USG showing nodular liver with multiple hepatic masses suspicious for hepatic metastatic disease. Workup for stroke was negative.  Spoke with patient at bedside. She reports her appetite remains poor. She had one Ensure yesterday and ate about 25% of her meals yesterday.   Meal Completion not recorded in chart. Per patient's report of intake, she has had approximately 656 kcal (53% minimum estimated kcal needs) and 29 grams of protein (47% minimum estimated protein needs) in the past 24 hrs.   Medications  reviewed and include: ceftriaxone, multivitamin with minerals daily, pantoprazole, NS @ 75 ml/hr.  Labs reviewed: CO2 19, BUN 35, Creatinine 1.06 on 1/25.  No new weight in chart to trend.  Discussed with NT who reports patient was NPO today for MRCP. Her diet has now advanced to soft diet, and she has had ice cream, Sprite, and water so far.   Diet Order:  DIET SOFT Room service appropriate? Yes; Fluid consistency: Thin  Skin:  Wound (see comment) (Stg II to coccyx)  Last BM:  12/08/2016  Height:   Ht Readings from Last 1 Encounters:  12/06/16 5' 3"  (1.6 m)    Weight:   Wt Readings from Last 1 Encounters:  12/06/16 91 lb 8 oz (41.5 kg)    Ideal Body Weight:  52.3 kg  BMI:  Body mass index is 16.21 kg/m.  Estimated Nutritional Needs:   Kcal:  1245-1450 (30-35 kcal/kg)  Protein:  62-75 grams (1.5-1.8 grams/kg)  Fluid:  1.2 L/day   EDUCATION NEEDS:   No education needs identified at this time  Willey Blade, MS, RD, LDN Pager: [phone] After Hours Pager: [phone]

## 2016-12-09 NOTE — Progress Notes (Signed)
[facility] [place] NAME: Grace Summers    MR#:  [id number]  DATE OF BIRTH:  08/16/1934  SUBJECTIVE:  Waiting for MRCP today, ate Ice-cream so couldn't get MRCP y'day. Hb at 8.7 (was 6.7 y'day) s/p 1 PRBC transfusion REVIEW OF SYSTEMS:   Review of Systems  Constitutional: Negative for chills, fever and weight loss.  HENT: Negative for ear discharge, ear pain and nosebleeds.   Eyes: Negative for blurred vision, pain and discharge.  Respiratory: Negative for sputum production, shortness of breath, wheezing and stridor.   Cardiovascular: Negative for chest pain, palpitations, orthopnea and PND.  Gastrointestinal: Negative for abdominal pain, diarrhea, nausea and vomiting.  Genitourinary: Negative for frequency and urgency.  Musculoskeletal: Positive for joint pain. Negative for back pain.  Neurological: Positive for weakness. Negative for sensory change, speech change and focal weakness.  Psychiatric/Behavioral: Negative for depression and hallucinations. The patient is not nervous/anxious.    Tolerating Diet:some Tolerating PT: SNF DRUG ALLERGIES:  No Known Allergies VITALS:  Blood pressure 127/66, pulse (!) 103, temperature 99.5 F (37.5 C), temperature source Oral, resp. rate 20, height 5\' 3"  (1.6 m), weight 41.5 kg (91 lb 8 oz), SpO2 96 %. PHYSICAL EXAMINATION:  Physical Exam  GENERAL:  83 y.o.-year-old patient lying in the bed with no acute distress. Weak, cachectic EYES: Pupils equal, round, reactive to light and accommodation. ++ scleral icterus. Extraocular muscles intact.  HEENT: Head atraumatic, normocephalic. Oropharynx and nasopharynx clear.  NECK:  Supple, no jugular venous distention. No thyroid enlargement, no tenderness.  LUNGS: Normal breath sounds bilaterally, no wheezing, rales, rhonchi. No use of accessory muscles of respiration.  CARDIOVASCULAR: S1, S2 normal. No murmurs, rubs, or gallops.  ABDOMEN: Soft,  nontender, nondistended. Bowel sounds present. No organomegaly or mass.  EXTREMITIES: No cyanosis, clubbing or edema b/l.    NEUROLOGIC grossly non focal PSYCHIATRIC:  patient is alert and pleasantly confused SKIN: No obvious rash, lesion, or ulcer. LABORATORY PANEL:  CBC  Recent Labs Lab 12/08/16 2131  WBC 21.2*  HGB 8.7*  HCT 25.6*  PLT 409    Chemistries   Recent Labs Lab 12/06/16 1421 12/08/16 0824  NA  --  136  K  --  3.5  CL  --  110  CO2  --  19*  GLUCOSE  --  68  BUN  --  35*  CREATININE 1.32* 1.06*  CALCIUM  --  7.4*  AST 159*  --   ALT 76*  --   ALKPHOS 431*  --   BILITOT 11.3*  --    Cardiac Enzymes  Recent Labs Lab 12/06/16 0926  TROPONINI 0.03*   RADIOLOGY:  US Abdomen Limited Ruq  Result Date: 12/07/2016 CLINICAL DATA:  Jaundice EXAM: US ABDOMEN LIMITED - RIGHT UPPER QUADRANT COMPARISON:  None FINDINGS: Gallbladder: A normal gallbladder is not visualized. Questionable gallbladder filled with shadowing calculi and sludge versus a bowel loop adjacent to the liver. No sonographic Murphy sign. Common bile duct: Diameter: 10 mm diameter, dilated Liver: Intrahepatic biliary dilatation. Nodular margins with heterogeneous parenchymal echogenicity. Multiple hypoechoic masses suspicious for metastatic disease. Largest nodule measures 2.5 x 3.1 x 4.2 cm in the LEFT lobe. Small amounts of scattered ascites throughout abdomen. Due to shadowing from gallstones and bowel gas, unable to identify a discrete mass obstructing the extrahepatic biliary tree at the porta hepatis or pancreatic head. IMPRESSION: A normal appearing gallbladder is not visualized. A structure with shadowing echogenic foci adjacent to  the liver edge may represent a gallbladder filled with shadowing calculi and sludge though a bowel loop is not entirely excluded; recommend correlation with surgical history. Nodular liver with multiple hepatic masses suspicious for hepatic metastatic disease.  Intrahepatic biliary dilatation and proximal extrahepatic biliary dilatation, though cause of the obstruction is not identified. Further assessment by CT abdomen with contrast or MR imaging with and without contrast recommended to assess liver, extrahepatic biliary tree, and gallbladder. Electronically Signed   By: Lavonia Dana M.D.   On: 12/07/2016 11:45   ASSESSMENT AND PLAN:  Grace Summers  is a 83 y.o. female withNo past medical history. Was sent to ED by family member due to above chief complaints. The patient has had significant weight loss for the past a few days, he has decreased appetite and generalized weakness. She complains right side weakness for 10 days. But she denies any slurred speech, dysphagia or incontinence. She denies any fever or chills, no cough, phlegm or shortness of breath  * Sepsis due to E.coli UTI. - Continue Rocephin, follow-up CBC and cultures.  *Jaundice: obstructive in nature -USG shows Nodular liver with multiple hepatic masses suspicious for hepatic metastatic disease -MRCP cancelled y'day as she ate ice-cream - GI consult couldn't be done as no GI coverage y'day/today till 7 pm -oncology consult after MRCP results -c/s palliative care - They tried but family unavailable today to meet, may need meeting on Monday  *right sided weakness -w/u for stroke Negative -old cva on CT head  *GI bleed: ?upper -dark marron blood clots -asa stopped -GI consult couldn't be done as no GI coverage today/tomorrow - coverage starts tomorrow at 7 pm  *Acute renal failure due to dehydration. Start IV fluid support and follow-up BMP.  * Lactic acidosis, treatment as above, follow-up lactic acid level.  * Failure to thrive. Dietitian consult. May need workup for malignancy.  * Acute blood loss Anemia: and underlying chronic disease and malnutrition. Hb improved to 8.7 (s/p 1 PRBC transfusion)  *Tobacco abuse. Smoking cessation was counseled for more than 4 minutes,  nicotine patch.    Case discussed with Care Management/Social Worker. Management plans discussed with the patient, family (d/w daughter [name] via phone [phone]) and they are in agreement.  CODE STATUS: DNR  DVT Prophylaxis: SCD  TOTAL TIME TAKING CARE OF THIS PATIENT: 30 minutes.   >50% time spent on counselling and coordination of care  POSSIBLE D/C IN 3-4 DAYS, DEPENDING ON CLINICAL CONDITION. And GI eval  Note: This dictation was prepared with Dragon dictation along with smaller phrase technology. Any transcriptional errors that result from this process are unintentional.  Max Sane M.D on 12/09/2016 at 8:20 AM  Between 7am to 6pm - Pager - [phone]  After 6pm go to [email or web address] - password [email or web address] [facility]  [facility]  Office  [phone]  CC: Primary care physician; No PCP Per Patient

## 2016-12-09 NOTE — Progress Notes (Signed)
mrcp  This am. tol well.  Med amt  Loose brown stool incont.

## 2016-12-09 NOTE — Clinical Social Work Placement (Signed)
   CLINICAL SOCIAL WORK PLACEMENT  NOTE  Date:  12/09/2016  Patient Details  Name: Lynnette Castles MRN: [id number] Date of Birth: July 18, 1934  Clinical Social Work is seeking post-discharge placement for this patient at the [place] level of care (*CSW will initial, date and re-position this form in  chart as items are completed):  Yes   Patient/family provided with [facility] Work Department's list of facilities offering this level of care within the geographic area requested by the patient (or if unable, by the patient's family).  Yes   Patient/family informed of their freedom to choose among providers that offer the needed level of care, that participate in Medicare, Medicaid or managed care program needed by the patient, have an available bed and are willing to accept the patient.  Yes   Patient/family informed of [facility]'s ownership interest in [facility] and [facility], as well as of the fact that they are under no obligation to receive care at these facilities.  PASRR submitted to EDS on 12/08/16     PASRR number received on 12/08/16     Existing PASRR number confirmed on       FL2 transmitted to all facilities in geographic area requested by pt/family on 12/08/16     FL2 transmitted to all facilities within larger geographic area on       Patient informed that his/her managed care company has contracts with or will negotiate with certain facilities, including the following:        Yes   Patient/family informed of bed offers received.  Patient chooses bed at [facility]     Physician recommends and patient chooses bed at      Patient to be transferred to   on  .  Patient to be transferred to facility by       Patient family notified on   of transfer.  Name of family member notified:        PHYSICIAN Please sign DNR, Please sign FL2     Additional Comment:     _______________________________________________ Ross Ludwig, LCSWA 12/09/2016, 4:38 PM

## 2016-12-09 NOTE — Progress Notes (Signed)
PT Cancellation Note  Patient Details Name: Grace Summers MRN: [id number] DOB: 07-01-1934   Cancelled Treatment:    Reason Eval/Treat Not Completed: Patient at procedure or test/unavailable (Treatment session attempted.  Patient currently off unit for diagnostic procedure (MRCP).  Will re-attempt at later time/date as medically appropriate and patient available.)   Tyren Dugar H. Owens Shark, PT, DPT, NCS 12/09/16, 9:15 AM [phone]

## 2016-12-09 NOTE — Clinical Social Work Note (Signed)
Clinical Social Work Assessment  Patient Details  Name: Grace Summers MRN: [id number] Date of Birth: 13-Dec-1933  Date of referral:  12/09/16               Reason for consult:  Facility Placement                Permission sought to share information with:  Facility Sport and exercise psychologist, Family Supports Permission granted to share information::  Yes, Verbal Permission Granted  Name::     Wilson,[name] Daughter [phone]   Agency::  SNF admissions  Relationship::     Contact Information:     Housing/Transportation Living arrangements for the past 2 months:  [place] of Information:  Patient, Adult Children Patient Interpreter Needed:  None Criminal Activity/Legal Involvement Pertinent to Current Situation/Hospitalization:  No - Comment as needed Significant Relationships:  Adult Children Lives with:  Self Do you feel safe going back to the place where you live?  No Need for family participation in patient care:  Yes (Comment)  Care giving concerns: Patient and family feel she needs some short term rehab before she can go home.   Social Worker assessment / plan:  Patient is a 83 year old female who is alert and oriented x4 and able to express her feelings.  Patient states she has not been to rehab before, CSW explained to her what to expect and what the process is for looking for SNF bed placement.  Patient was also explained how insurance will pay for her stay at SNF.  CSW discussed with patient what her environment is she states she lives with her sister who still works, so patient feels she needs some rehab before she is able to return back home.  CSW was given permission to begin bed search process in [facility].  Patient did not have any other questions or concerns.  Employment status:  Retired Forensic scientist:  [employer] PT Recommendations:  [place] / Referral to community resources:  [place]  Patient/Family's Response to care:  Patient and family are agreeable to SNF for rehab  Patient/Family's Understanding of and Emotional Response to Diagnosis, Current Treatment, and Prognosis: Patient expressed she is motivated to go back home after getting some therapy.  Emotional Assessment Appearance:  Appears stated age Attitude/Demeanor/Rapport:    Affect (typically observed):  Appropriate, Calm, Stable Orientation:  Oriented to Self, Oriented to Place, Oriented to  Time, Oriented to Situation Alcohol / Substance use:  Not Applicable Psych involvement (Current and /or in the community):  No (Comment)  Discharge Needs  Concerns to be addressed:  Lack of Support Readmission within the last 30 days:  No Current discharge risk:  Lack of support system, Lives alone Barriers to Discharge:  Continued Medical Work up   Anell Barr 12/09/2016, 3:19 PM

## 2016-12-09 NOTE — Consult Note (Signed)
Consultation Note Date: 12/09/2016   Patient Name: Grace Summers  DOB: [birth year]  MRN: [id number]  Age / Sex: 83 y.o., female  PCP: No Pcp Per Patient Referring Physician: Max Sane, MD  Reason for Consultation: Establishing goals of care  HPI/Patient Profile: 83 y.o. female  with no past medical history admitted on 12/06/2016 with weakness, leg swelling, weight loss, and failure to thrive. Patient with right sided weakness x10 days but no other stroke related symptoms. In ED, found to have leukocytosis and tachycardia. Positive for UTI. CT head suspicious for acute to subacute lacunar infarct and mild chronic small vessel disease. MRI brain revealed chronic left sided ischemic disease. Carotid dopplers with no significant stenosis. Neurology following conservatively. Patient with jaundice and elevated liver functions. Ultrasound reveals nodular liver with multiple hepatic masses suspicious for hepatic metastatic disease. MRCP confirmed large hepatic mass likely obstructing upper common bile duct possibly indicating cholangiocarcinoma, numerous metastatic hepatic lesions, and ascites. Palliative medicine consultation for goals of care/poor prognosis.   Clinical Assessment and Goals of Care: I have reviewed medical records, discussed with Dr. Manuella Ghazi and met with patient and family at bedside to discuss diagnosis, prognosis, GOC, EOL wishes, disposition and options. Daughter, sister, and niece at bedside.   I introduced Palliative Medicine as specialized medical care for people living with serious illness. It focuses on providing relief from the symptoms and stress of a serious illness. The goal is to improve quality of life for both the patient and the family.  We discussed a brief life review of the patient. She has two children (daughter and son). She worked at a tobacco factory for many years. She loved to travel  and has been around the country with her family. [name] lives with her sister. Prior to hospitalization, she was not driving but able to ambulate independently and perform ADL's. Family speaks of a gradual decline since August with increased weakness, poor appetite, and 50 lb weight loss. She does not have a primary care doctor and has not seen a doctor in numerous years. Daughter would continually ask, especially since August, if her mother would go to a doctor but she always refused. Independence and family are important to her.   Prior to my visit, Daughter spoke with Dr. Manuella Ghazi and understands results from MRCP. Also that her mother would likely would not be strong enough to pursue workup or chemo, due to malnutrition. Daughter explains to her mother that she would not want to put her through aggressive measures and would rather she have quality days for the time she has left-stating "you have a lived a good life and experienced places you never imagined you would visit." Patient agrees with what her family thinks is best for her.   The difference between aggressive medical interventions and comfort care was considered in light of the patient's goals of care. Patient and family agree that comfort is the focus for each day she has left.  No documented POA or living will. Patient and daughter confirmed code status  is a DNR.   Hospice and Palliative Care services outpatient were explained and offered. At this point, I do not feel she is eligible for a hospice facility but educated on hospice services that could be available to her at home. Sister is gone 10 hours a day and does not feel comfortable with her returning home knowing she will be alone the majority of the day. She would rather her go to a nursing facility for rehab with palliative services. They are open to transition to hospice when she declines as disease progresses.   Answered questions and provided support. Family has my contact  information.    SUMMARY OF RECOMMENDATIONS    DNR/DNI  Discussed aggressive medical interventions including oncology involvement versus comfort approach. Patient and family wish for comfort and do not want to pursue further evaluation for possible metastatic cancer.   Family would like her to go to SNF to attempt rehab. Agreeable with palliative services to follow on discharge and open to transition to hospice services if she should decline.   PMT not at [facility] over the weekend but will f/u next week if patient is still hospitalized.   Code Status/Advance Care Planning:  DNR   Symptom Management:   Per attending  Palliative Prophylaxis:   Aspiration, Delirium Protocol, Frequent Pain Assessment, Oral Care and Turn Reposition  Psycho-social/Spiritual:   Desire for further Chaplaincy support:yes  Additional Recommendations: Caregiving  Support/Resources and Education on Hospice  Prognosis:   < 3 months if not less with severe nutritional/functional status decline and possible metastatic cancer without further evaluation per patient and family.   Discharge Planning: [facility] for rehab with Palliative care service follow-up [facility] for hospice services if unable to participate in rehab.      Primary Diagnoses: Present on Admission: . Sepsis ([place])   I have reviewed the medical record, interviewed the patient and family, and examined the patient. The following aspects are pertinent.  History reviewed. No pertinent past medical history. Social History   Social History  . Marital status: Single    Spouse name: N/A  . Number of children: N/A  . Years of education: N/A   Social History Main Topics  . Smoking status: Current Every Day Smoker    Packs/day: 0.50    Years: 60.00    Types: Cigarettes  . Smokeless tobacco: Never Used  . Alcohol use No  . Drug use: No  . Sexual activity: Not Currently    Birth control/ protection: None   Other Topics  Concern  . None   Social History Narrative  . None   Family History  Problem Relation Age of Onset  . Breast cancer Mother   . Liver cancer Father    Scheduled Meds: . cefTRIAXone  1 g Intravenous Q24H  . feeding supplement (ENSURE ENLIVE)  237 mL Oral BID BM  . levofloxacin (LEVAQUIN) IV  250 mg Intravenous Q24H  . mouth rinse  15 mL Mouth Rinse BID  . multivitamin with minerals  1 tablet Oral Daily  . pantoprazole  40 mg Oral Daily   Continuous Infusions: . sodium chloride 75 mL/hr at 12/09/16 0609   PRN Meds:.acetaminophen **OR** acetaminophen (TYLENOL) oral liquid 160 mg/5 mL **OR** acetaminophen, senna-docusate Medications Prior to Admission:  Prior to Admission medications   Not on File   No Known Allergies Review of Systems  Constitutional: Positive for activity change, appetite change and unexpected weight change.  Gastrointestinal: Positive for nausea and vomiting.  Neurological: Positive  for weakness.   Physical Exam  Constitutional: She is oriented to person, place, and time. She appears cachectic. She is cooperative. She appears ill.  HENT:  Head: Normocephalic and atraumatic.  Cardiovascular: Regular rhythm and normal heart sounds.   Pulmonary/Chest: Breath sounds normal. No accessory muscle usage. No tachypnea. No respiratory distress.  Abdominal: Bowel sounds are normal. She exhibits distension. There is no tenderness.  Neurological: She is alert and oriented to person, place, and time.  Skin: Skin is warm and dry.  Psychiatric: She has a normal mood and affect. Her speech is normal and behavior is normal. Cognition and memory are normal.  Nursing note and vitals reviewed.  Vital Signs: BP (!) 141/70 (BP Location: Right Arm)   Pulse 98   Temp 99 F (37.2 C) (Oral)   Resp 20   Ht _0  (1.6 m)   Wt 41.5 kg (91 lb 8 oz)   SpO2 96%   BMI 16.21 kg/m  Pain Assessment: No/denies pain   Pain Score: 0-No pain  SpO2: SpO2: 96 % O2 Device:SpO2: 96  % O2 Flow Rate: .   IO: Intake/output summary:   Intake/Output Summary (Last 24 hours) at 12/09/16 1621 Last data filed at 12/09/16 8588  Gross per 24 hour  Intake          1169.75 ml  Output                0 ml  Net          1169.75 ml    LBM: Last BM Date: 12/08/16 Baseline Weight: Weight: 45.4 kg (100 lb) Most recent weight: Weight: 41.5 kg (91 lb 8 oz)     Palliative Assessment/Data: PPS 40%   Flowsheet Rows   Flowsheet Row Most Recent Value  Intake Tab  Referral Department  Hospitalist  Unit at Time of Referral  Oncology Unit  Palliative Care Primary Diagnosis  Cancer  Date Notified  12/08/16  Palliative Care Type  New Palliative care  Reason for referral  Clarify Goals of Care  Date of Admission  12/06/16  Date first seen by Palliative Care  12/09/16  # of days IP prior to Palliative referral  2  Clinical Assessment  Palliative Performance Scale Score  40%  Psychosocial & Spiritual Assessment  Palliative Care Outcomes  Patient/Family meeting held?  Yes  Who was at the meeting?  patient, daughter, sister, and niece  Palliative Care Outcomes  Clarified goals of care, Counseled regarding hospice, Provided end of life care assistance, ACP counseling assistance, Provided psychosocial or spiritual support, Linked to palliative care logitudinal support      Time In: 1500 Time Out: 1615 Time Total: 75 min Greater than 50%  of this time was spent counseling and coordinating care related to the above assessment and plan.  Signed by:  Ihor Dow, FNP-C Palliative Medicine Team  Phone: [phone] Fax: [phone]   Please contact Palliative Medicine Team phone at [phone] for questions and concerns.  For individual provider: See Shea Evans

## 2016-12-09 NOTE — Progress Notes (Signed)
Pharmacy Antibiotic Note  Grace Summers is a 83 y.o. female admitted on 12/06/2016 with UTI.  Pharmacy  consulted for ceftriaxone dosing.  Plan: Ceftriaxone 1 g IV daily  Height: 5\' 3"  (160 cm) Weight: 91 lb 8 oz (41.5 kg) IBW/kg (Calculated) : 52.4  Temp (24hrs), Avg:98.9 F (37.2 C), Min:98.2 F (36.8 C), Max:99.7 F (37.6 C)   Recent Labs Lab 12/06/16 0926 12/06/16 1107 12/06/16 1331 12/06/16 1421 12/08/16 0824 12/08/16 2131  WBC 26.2*  --   --   --  20.6* 21.2*  CREATININE 1.50*  --   --  1.32* 1.06*  --   LATICACIDVEN  --  3.8* 3.8*  --   --   --     Estimated Creatinine Clearance: 27.7 mL/min (by C-G formula based on SCr of 1.06 mg/dL (H)).    No Known Allergies  Antimicrobials this admission: CTX 1/23 >>   Dose adjustments this admission:  Microbiology results: 1/23 BCx: Sent 1/23 UCx: 100k CFU GNR    Thank you for allowing pharmacy to be a part of this patient's care.  Larene Beach, PharmD, BCPS Clinical Pharmacist 12/09/2016 8:02 AM

## 2016-12-10 LAB — CBC
HCT: 26.7 % — ABNORMAL LOW (ref 35.0–47.0)
Hemoglobin: 8.9 g/dL — ABNORMAL LOW (ref 12.0–16.0)
MCH: 30.3 pg (ref 26.0–34.0)
MCHC: 33.5 g/dL (ref 32.0–36.0)
MCV: 90.7 fL (ref 80.0–100.0)
Platelets: 411 10*3/uL (ref 150–440)
RBC: 2.94 MIL/uL — ABNORMAL LOW (ref 3.80–5.20)
RDW: 21.4 % — ABNORMAL HIGH (ref 11.5–14.5)
WBC: 20.7 10*3/uL — AB (ref 3.6–11.0)

## 2016-12-10 LAB — BASIC METABOLIC PANEL
ANION GAP: 5 (ref 5–15)
BUN: 30 mg/dL — ABNORMAL HIGH (ref 6–20)
CALCIUM: 7.7 mg/dL — AB (ref 8.9–10.3)
CO2: 19 mmol/L — ABNORMAL LOW (ref 22–32)
CREATININE: 1.05 mg/dL — AB (ref 0.44–1.00)
Chloride: 113 mmol/L — ABNORMAL HIGH (ref 101–111)
GFR, EST AFRICAN AMERICAN: 57 mL/min — AB (ref >60)
GFR, EST NON AFRICAN AMERICAN: 49 mL/min — AB (ref >60)
Glucose, Bld: 63 mg/dL — ABNORMAL LOW (ref 65–99)
Potassium: 3.6 mmol/L (ref 3.5–5.1)
SODIUM: 137 mmol/L (ref 135–145)

## 2016-12-10 LAB — LACTIC ACID, PLASMA
LACTIC ACID, VENOUS: 1.6 mmol/L (ref 0.5–1.9)
LACTIC ACID, VENOUS: 2.4 mmol/L — AB (ref 0.5–1.9)

## 2016-12-10 MED ORDER — CEPHALEXIN 500 MG PO CAPS
500.0000 mg | ORAL_CAPSULE | Freq: Three times a day (TID) | ORAL | Status: DC
  Administered 2016-12-10 – 2016-12-11 (×4): 500 mg via ORAL
  Filled 2016-12-10 (×4): qty 1

## 2016-12-10 NOTE — Consult Note (Signed)
Patient is an 83 year old female recently admitted to the hospital with increasing weakness and declining performance status. Subsequent workup noted multiple hepatic masses consistent with metastatic disease. Appreciate palliative care input.  Patient has been evaluated by palliative care and has indicated that she does not want to pursue further evaluation or any treatment for underlying malignancy. By report, she is open to hospice services. No further intervention is needed. Will do full oncology consult on Sunday if necessary and if patient remains in the hospital.  Appreciate consult, full consult to follow.

## 2016-12-10 NOTE — Progress Notes (Signed)
[place] [facility] [place] NAME: Grace Summers    MR#:  [id number]  DATE OF BIRTH:  03/27/1934  SUBJECTIVE:  Patient denies any abdominal pain today. Resting comfortably. Received 1 unit of blood yesterday. REVIEW OF SYSTEMS:   Review of Systems  Constitutional: Negative for chills, fever and weight loss.  HENT: Negative for ear discharge, ear pain and nosebleeds.   Eyes: Negative for blurred vision, pain and discharge.  Respiratory: Negative for sputum production, shortness of breath, wheezing and stridor.   Cardiovascular: Negative for chest pain, palpitations, orthopnea and PND.  Gastrointestinal: Negative for abdominal pain, diarrhea, nausea and vomiting.  Genitourinary: Negative for frequency and urgency.  Musculoskeletal: Negative for back pain and joint pain.  Neurological: Positive for weakness. Negative for sensory change, speech change and focal weakness.  Psychiatric/Behavioral: Negative for depression and hallucinations. The patient is not nervous/anxious.    Tolerating Diet:some Tolerating PT: SNF DRUG ALLERGIES:  No Known Allergies VITALS:  Blood pressure (!) 155/70, pulse 89, temperature 98 F (36.7 C), temperature source Oral, resp. rate 18, height 5\' 3"  (1.6 m), weight 41.5 kg (91 lb 8 oz), SpO2 97 %. PHYSICAL EXAMINATION:  Physical Exam  GENERAL:  83 y.o.-year-old patient lying in the bed with no acute distress. Weak, cachectic EYES: Pupils equal, round, reactive to light and accommodation. ++ scleral icterus. Extraocular muscles intact.  HEENT: Head atraumatic, normocephalic. Oropharynx and nasopharynx clear.  NECK:  Supple, no jugular venous distention. No thyroid enlargement, no tenderness.  LUNGS: Normal breath sounds bilaterally, no wheezing, rales, rhonchi. No use of accessory muscles of respiration.  CARDIOVASCULAR: S1, S2 normal. No murmurs, rubs, or gallops.  ABDOMEN: Soft, nontender, nondistended. Bowel  sounds present. No organomegaly or mass.  EXTREMITIES: No cyanosis, clubbing or edema b/l.    NEUROLOGIC grossly non focal PSYCHIATRIC:  patient is alert and pleasantly confused SKIN: No obvious rash, lesion, or ulcer. LABORATORY PANEL:  CBC  Recent Labs Lab 12/10/16 0543  WBC 20.7*  HGB 8.9*  HCT 26.7*  PLT 411    Chemistries   Recent Labs Lab 12/06/16 1421  12/10/16 0543  NA  --   < > 137  K  --   < > 3.6  CL  --   < > 113*  CO2  --   < > 19*  GLUCOSE  --   < > 63*  BUN  --   < > 30*  CREATININE 1.32*  < > 1.05*  CALCIUM  --   < > 7.7*  AST 159*  --   --   ALT 76*  --   --   ALKPHOS 431*  --   --   BILITOT 11.3*  --   --   < > = values in this interval not displayed. Cardiac Enzymes  Recent Labs Lab 12/06/16 0926  TROPONINI 0.03*   RADIOLOGY:  Mr 3d Recon At Scanner  Result Date: 12/09/2016 CLINICAL DATA:  Followup abnormal ultrasound examination. EXAM: MRI ABDOMEN WITHOUT AND WITH CONTRAST (INCLUDING MRCP) TECHNIQUE: Multiplanar multisequence MR imaging of the abdomen was performed both before and after the administration of intravenous contrast. Heavily T2-weighted images of the biliary and pancreatic ducts were obtained, and three-dimensional MRCP images were rendered by post processing. CONTRAST:  83mL MULTIHANCE GADOBENATE DIMEGLUMINE 529 MG/ML IV SOLN COMPARISON:  Ultrasound 12/07/2016 FINDINGS: Examination is quite limited due to patient motion. Lower chest: Bilateral pleural effusions, right greater than left. Associated overlying atelectasis. No pericardial  effusion. No obvious lung lesions. Hepatobiliary: Large irregular enhancing central liver mass with numerous other smaller hepatic lesions in both lobes. There is also marked intrahepatic biliary dilatation without abrupt cut off of the common hepatic or upper common bile duct. Findings suspicious for cholangiocarcinoma and metastatic disease. Stone filled gallbladder with severe surrounding  inflammations/fluid. Pancreas: No mass, inflammation or ductal dilatation. Mild pancreatic atrophy. Spleen:  Normal size.  No focal lesions. Adrenals/Urinary Tract: The adrenal glands and kidneys are grossly normal. Small cysts are noted. Stomach/Bowel: The stomach is markedly distended. Moderate thickening of the distal gastric antrum/ pyloric region with suspected enhancement. Could not exclude tumor involvement of the stomach causing the gastric outlet obstruction. The second portion the duodenum appears grossly normal. The visualized small bowel and colon are grossly normal. Vascular/Lymphatic: The aorta and branch vessels are grossly normal. The portal and splenic veins appear patent. The intrahepatic IVC is compressed by tumor but appears patent. Suspect portable adenopathy/mass. Other: Large volume abdominal ascites, possibly malignant. No obvious omental disease but this would be much better evaluated with CT. Musculoskeletal: No obvious bone lesions. IMPRESSION: 1. Very limited examination due to patient motion. 2. Large central hepatic infiltrative mass likely obstructing the upper common bile duct and worrisome for cholangiocarcinoma. Percutaneous biliary drainage may be indicated along with ultrasound guided liver biopsy. 3. Numerous metastatic hepatic lesions. Suspect portal adenopathy and possible tumor obstructing the stomach. 4. Large volume abdominal ascites, possibly malignant. 5. Cholelithiasis and marked gallbladder wall thickening likely due to cystic duct obstruction. 6. Bilateral pleural effusions and bibasilar atelectasis. 7. CT chest abdomen pelvis may be helpful for further evaluation of metastatic disease. Electronically Signed   By: Marijo Sanes M.D.   On: 12/09/2016 11:01   Mr Abdomen Mrcp Moise Boring Contast  Result Date: 12/09/2016 CLINICAL DATA:  Followup abnormal ultrasound examination. EXAM: MRI ABDOMEN WITHOUT AND WITH CONTRAST (INCLUDING MRCP) TECHNIQUE: Multiplanar multisequence MR  imaging of the abdomen was performed both before and after the administration of intravenous contrast. Heavily T2-weighted images of the biliary and pancreatic ducts were obtained, and three-dimensional MRCP images were rendered by post processing. CONTRAST:  60mL MULTIHANCE GADOBENATE DIMEGLUMINE 529 MG/ML IV SOLN COMPARISON:  Ultrasound 12/07/2016 FINDINGS: Examination is quite limited due to patient motion. Lower chest: Bilateral pleural effusions, right greater than left. Associated overlying atelectasis. No pericardial effusion. No obvious lung lesions. Hepatobiliary: Large irregular enhancing central liver mass with numerous other smaller hepatic lesions in both lobes. There is also marked intrahepatic biliary dilatation without abrupt cut off of the common hepatic or upper common bile duct. Findings suspicious for cholangiocarcinoma and metastatic disease. Stone filled gallbladder with severe surrounding inflammations/fluid. Pancreas: No mass, inflammation or ductal dilatation. Mild pancreatic atrophy. Spleen:  Normal size.  No focal lesions. Adrenals/Urinary Tract: The adrenal glands and kidneys are grossly normal. Small cysts are noted. Stomach/Bowel: The stomach is markedly distended. Moderate thickening of the distal gastric antrum/ pyloric region with suspected enhancement. Could not exclude tumor involvement of the stomach causing the gastric outlet obstruction. The second portion the duodenum appears grossly normal. The visualized small bowel and colon are grossly normal. Vascular/Lymphatic: The aorta and branch vessels are grossly normal. The portal and splenic veins appear patent. The intrahepatic IVC is compressed by tumor but appears patent. Suspect portable adenopathy/mass. Other: Large volume abdominal ascites, possibly malignant. No obvious omental disease but this would be much better evaluated with CT. Musculoskeletal: No obvious bone lesions. IMPRESSION: 1. Very limited examination due to  patient  motion. 2. Large central hepatic infiltrative mass likely obstructing the upper common bile duct and worrisome for cholangiocarcinoma. Percutaneous biliary drainage may be indicated along with ultrasound guided liver biopsy. 3. Numerous metastatic hepatic lesions. Suspect portal adenopathy and possible tumor obstructing the stomach. 4. Large volume abdominal ascites, possibly malignant. 5. Cholelithiasis and marked gallbladder wall thickening likely due to cystic duct obstruction. 6. Bilateral pleural effusions and bibasilar atelectasis. 7. CT chest abdomen pelvis may be helpful for further evaluation of metastatic disease. Electronically Signed   By: Marijo Sanes M.D.   On: 12/09/2016 11:01   ASSESSMENT AND PLAN:  Cartney Schirmer  is a 83 y.o. female withNo past medical history. Was sent to ED by family member due to above chief complaints. The patient has had significant weight loss for the past a few days, he has decreased appetite and generalized weakness. She complains right side weakness for 10 days. But she denies any slurred speech, dysphagia or incontinence. She denies any fever or chills, no cough, phlegm or shortness of breath  * Sepsis due to E.coli UTI. - Treated with Rocephin  Pansensitive discontinue IV Rocephin and start patient on by mouth Keflex  *Jaundice: obstructive in nature -USG shows Nodular liver with multiple hepatic masses suspicious for hepatic metastatic disease -MRCP -Large central hepatic infiltrative mass likely obstructing the upper common bile duct and worrisome for cholangiocarcinoma. Numerous metastatic hepatic lesions were noticed suspect portal adenopathy and possible tumor obstructing the stomach. Large volume abdominal ascites possibly malignant -Daughter is refusing any aggressive measures - GI and oncology is recommending palliative care as patient's daughter is refusing aggressive measures at this time and patient is not symptomatic -Daughter is coming  from [place] and insisted talking to oncology and Education officer, museum face-to-face, though she is not considering any aggressive interventions at this time  *right sided weakness -w/u for stroke Negative -old cva on CT head  *GI bleed: ?upper -dark marron blood clots -asa stopped -Status post 1 unit of blood transfusion. Hemoglobin is stable at 8.7.  -GI signed off as there is no active bleeding and daughter is not considering aggressive measures at this time including any procedures   *Acute renal failure due to dehydration. Improved with IV fluids. Creatinine at 1.05 and GFR at 57   * Lactic acidosis, treatment as above, follow-up lactic acid level.  * Failure to thrive. Dietitian consult.  * Acute blood loss Anemia: and underlying chronic disease and malnutrition. Hb improved to 8.7 (s/p 1 PRBC transfusion)  *Tobacco abuse. Smoking cessation was counseled for more than 4 minutes, nicotine patch.   PT is recommending skilled nursing facility. Bed is available    Case discussed with Care Management/Social Worker. Management plans discussed with the patient, family (d/w daughter [name] via phone [phone]) andShe prefers talking to the oncologist Dr. Grayland Ormond face-to-face as well as with the social worker   CODE STATUS: DNR  DVT Prophylaxis: SCD  TOTAL TIME TAKING CARE OF THIS PATIENT: 36 minutes.   >50% time spent on counselling and coordination of care  POSSIBLE D/C IN 3-4 DAYS, DEPENDING ON CLINICAL CONDITION. And GI eval  Note: This dictation was prepared with Dragon dictation along with smaller phrase technology. Any transcriptional errors that result from this process are unintentional.  Nicholes Mango M.D on 12/10/2016 at 4:10 PM  Between 7am to 6pm - Pager - [phone]  After 6pm go to [email or web address] - password [email or web address] [facility]  [facility]  Office  [phone]  CC: Primary care physician;  No PCP Per Patient

## 2016-12-10 NOTE — Clinical Social Work Note (Signed)
CSW contacted [facility] [place]. The facility representative reported that they could not receive an admission past 3 pm. CSW attempted to call admissions coordinator with no answer and left voicemail. CSW will attempt dc on 12/11/2016.  Santiago Bumpers, MSW, Latanya Presser [phone]

## 2016-12-10 NOTE — Progress Notes (Signed)
Physical Therapy Treatment Patient Details Name: Grace Summers MRN: [id number] DOB: March 01, 1934 Today's Date: 12/10/2016    History of Present Illness presented to ER with progressive weakness, LE swelling and R UE weakness x10 days; admitted with sepsis related to UTI.  Head CT, MRI negative for acute change.  Noted with mild jaundice upon presentation; pending abdominal US.    PT Comments    Pt in bed, ready to get up.  Bed mobility without assist and use of rail, sitting without support.  She was able to stand and ambulate 150' with walker and min assist.  Pt with overall poor safety with transfers and gait today.  Poor hand placements during sit to stand, pulling up on walker despite verbal cues to correct.  During gait, she is generally unsteady but no lob's.  Frequently running into walls and objects in hallway needing verbal and tactile cues to correct.  Upon return to room she sat sideways into her chair.  She reported general fatigue with gait today stating "I'm kinda tired".  She declined further activity but remained out of bed in recliner for breakfast. She is unsafe to ambulate without assist due to poor safety and balance.   Follow Up Recommendations  SNF     Equipment Recommendations  Rolling walker with 5" wheels    Recommendations for Other Services       Precautions / Restrictions Precautions Precautions: Fall Restrictions Weight Bearing Restrictions: No    Mobility  Bed Mobility Overal bed mobility: Needs Assistance Bed Mobility: Supine to Sit     Supine to sit: Supervision        Transfers Overall transfer level: Needs assistance Equipment used: Rolling walker (2 wheeled) Transfers: Sit to/from Stand Sit to Stand: Min guard         General transfer comment: poor hand placement when standing, does not turn fully before sitting and sits sidways into chair.   Ambulation/Gait Ambulation/Gait assistance: Min assist Ambulation Distance (Feet): 150  Feet Assistive device: Rolling walker (2 wheeled)     Gait velocity interpretation: <1.8 ft/sec, indicative of risk for recurrent falls General Gait Details: mild sway but self  corrects.  Running into objects in hallway with poor awareness and general safety.  Fatigued quicker today than during last session,   Stairs            Wheelchair Mobility    Modified Rankin (Stroke Patients Only)       Balance Overall balance assessment: Needs assistance Sitting-balance support: No upper extremity supported;Feet supported Sitting balance-Leahy Scale: Good     Standing balance support: Bilateral upper extremity supported Standing balance-Leahy Scale: Fair                      Cognition Arousal/Alertness: Awake/alert Behavior During Therapy: WFL for tasks assessed/performed Overall Cognitive Status: Within Functional Limits for tasks assessed                      Exercises Other Exercises Other Exercises: declined further activity due to fatigue but remained out of bed in recliner.    General Comments        Pertinent Vitals/Pain Pain Assessment: No/denies pain    Home Living                      Prior Function            PT Goals (current goals can now be found in the care  plan section)      Frequency    Min 2X/week      PT Plan      Co-evaluation             End of Session Equipment Utilized During Treatment: Gait belt Activity Tolerance: Patient tolerated treatment well Patient left: in chair;with call bell/phone within reach;with chair alarm set     Time: [id number] PT Time Calculation (min) (ACUTE ONLY): 16 min  Charges:  $Gait Training: 8-22 mins                    G Codes:      Chesley Noon 2017/01/06, 8:41 AM

## 2016-12-10 NOTE — Plan of Care (Signed)
Problem: Physical Regulation: Goal: Will remain free from infection Outcome: Progressing Pt receiving IV antibiotics   

## 2016-12-10 NOTE — Consult Note (Signed)
Consultation  Referring Provider:     No ref. provider found Primary Care Physician:  No PCP Per Patient Primary Gastroenterologist:  None        Reason for Consultation: jaundice  Date of Admission:  12/06/2016 Date of Consultation:  12/10/2016         HPI:   Grace Summers is a 83 y.o. female with no known past medical history who is bought to the ER for concern of a stroke and found to have obstructive jaundice.  The patient is a poor historian and family was not available at bedside, so history is derived primarily from the chart.   The patient reports that she was concerned she was having a stroke because she was so weak and that's why she was bought to the ER. She continues to feel weak, but she is unsure why she remains hospitalized. She does admit to significant weight loss, although she is unable to provide further details. She also reports vague intermittent abdominal pain, but believes it's related to her bowels.  She denies pruritis, nausea, emesis, diarrhea or constipation. She also denies hematemesis, coffee ground emesis, hematochezia or melena.   History reviewed. No pertinent past medical history.  Past Surgical History:  Procedure Laterality Date  . CATARACT EXTRACTION      Prior to Admission medications   Not on File    Family History  Problem Relation Age of Onset  . Breast cancer Mother   . Liver cancer Father      Social History  Substance Use Topics  . Smoking status: Current Every Day Smoker    Packs/day: 0.50    Years: 60.00    Types: Cigarettes  . Smokeless tobacco: Never Used  . Alcohol use No    Allergies as of 12/06/2016  . (No Known Allergies)    Review of Systems:    All systems reviewed and negative except where noted in HPI.   Physical Exam:  Vital signs in last 24 hours: Temp:  [97.9 F (36.6 C)-98.7 F (37.1 C)] 98 F (36.7 C) (01/27 1144) Pulse Rate:  [94-100] 100 (01/27 1144) Resp:  [20] 20 (01/27 1144) BP:  (149-157)/(69-85) 157/72 (01/27 1144) SpO2:  [96 %-100 %] 98 % (01/27 1144) Last BM Date: 12/10/16 General: Petite cachetic appearing black older female curled up in a recliner watching TV comfortably Head:  Normocephalic Eyes: deep scleral icterus Ears:  Normal auditory acuity. Lungs: no respiratory distress, clear to auscultation  Heart: Regular rate and rhythm, normal S1, S2 Abdomen: +BS, soft, mildly distended and tympanic to percussion, nontender.  Extremities: Without edema, cyanosis or clubbing. Neurologic:  Alert and oriented x3 and responds appropriately to questions, although slow to respond Skin: deeply jaundiced  LAB RESULTS:  Recent Labs  12/08/16 0824 12/08/16 2131 12/10/16 0543  WBC 20.6* 21.2* 20.7*  HGB 6.7* 8.7* 8.9*  HCT 20.4* 25.6* 26.7*  PLT 427 409 411   BMET  Recent Labs  12/08/16 0824 12/10/16 0543  NA 136 137  K 3.5 3.6  CL 110 113*  CO2 19* 19*  GLUCOSE 68 63*  BUN 35* 30*  CREATININE 1.06* 1.05*  CALCIUM 7.4* 7.7*   LFT No results for input(s): PROT, ALBUMIN, AST, ALT, ALKPHOS, BILITOT, BILIDIR, IBILI in the last 72 hours. PT/INR No results for input(s): LABPROT, INR in the last 72 hours.  STUDIES: Mr 3d Recon At Scanner  Result Date: 12/09/2016 CLINICAL DATA:  Followup abnormal ultrasound examination. EXAM: MRI ABDOMEN WITHOUT AND WITH  CONTRAST (INCLUDING MRCP) TECHNIQUE: Multiplanar multisequence MR imaging of the abdomen was performed both before and after the administration of intravenous contrast. Heavily T2-weighted images of the biliary and pancreatic ducts were obtained, and three-dimensional MRCP images were rendered by post processing. CONTRAST:  21mL MULTIHANCE GADOBENATE DIMEGLUMINE 529 MG/ML IV SOLN COMPARISON:  Ultrasound 12/07/2016 FINDINGS: Examination is quite limited due to patient motion. Lower chest: Bilateral pleural effusions, right greater than left. Associated overlying atelectasis. No pericardial effusion. No obvious  lung lesions. Hepatobiliary: Large irregular enhancing central liver mass with numerous other smaller hepatic lesions in both lobes. There is also marked intrahepatic biliary dilatation without abrupt cut off of the common hepatic or upper common bile duct. Findings suspicious for cholangiocarcinoma and metastatic disease. Stone filled gallbladder with severe surrounding inflammations/fluid. Pancreas: No mass, inflammation or ductal dilatation. Mild pancreatic atrophy. Spleen:  Normal size.  No focal lesions. Adrenals/Urinary Tract: The adrenal glands and kidneys are grossly normal. Small cysts are noted. Stomach/Bowel: The stomach is markedly distended. Moderate thickening of the distal gastric antrum/ pyloric region with suspected enhancement. Could not exclude tumor involvement of the stomach causing the gastric outlet obstruction. The second portion the duodenum appears grossly normal. The visualized small bowel and colon are grossly normal. Vascular/Lymphatic: The aorta and branch vessels are grossly normal. The portal and splenic veins appear patent. The intrahepatic IVC is compressed by tumor but appears patent. Suspect portable adenopathy/mass. Other: Large volume abdominal ascites, possibly malignant. No obvious omental disease but this would be much better evaluated with CT. Musculoskeletal: No obvious bone lesions. IMPRESSION: 1. Very limited examination due to patient motion. 2. Large central hepatic infiltrative mass likely obstructing the upper common bile duct and worrisome for cholangiocarcinoma. Percutaneous biliary drainage may be indicated along with ultrasound guided liver biopsy. 3. Numerous metastatic hepatic lesions. Suspect portal adenopathy and possible tumor obstructing the stomach. 4. Large volume abdominal ascites, possibly malignant. 5. Cholelithiasis and marked gallbladder wall thickening likely due to cystic duct obstruction. 6. Bilateral pleural effusions and bibasilar atelectasis. 7.  CT chest abdomen pelvis may be helpful for further evaluation of metastatic disease. Electronically Signed   By: Marijo Sanes M.D.   On: 12/09/2016 11:01   Mr Abdomen Mrcp Moise Boring Contast  Result Date: 12/09/2016 CLINICAL DATA:  Followup abnormal ultrasound examination. EXAM: MRI ABDOMEN WITHOUT AND WITH CONTRAST (INCLUDING MRCP) TECHNIQUE: Multiplanar multisequence MR imaging of the abdomen was performed both before and after the administration of intravenous contrast. Heavily T2-weighted images of the biliary and pancreatic ducts were obtained, and three-dimensional MRCP images were rendered by post processing. CONTRAST:  8mL MULTIHANCE GADOBENATE DIMEGLUMINE 529 MG/ML IV SOLN COMPARISON:  Ultrasound 12/07/2016 FINDINGS: Examination is quite limited due to patient motion. Lower chest: Bilateral pleural effusions, right greater than left. Associated overlying atelectasis. No pericardial effusion. No obvious lung lesions. Hepatobiliary: Large irregular enhancing central liver mass with numerous other smaller hepatic lesions in both lobes. There is also marked intrahepatic biliary dilatation without abrupt cut off of the common hepatic or upper common bile duct. Findings suspicious for cholangiocarcinoma and metastatic disease. Stone filled gallbladder with severe surrounding inflammations/fluid. Pancreas: No mass, inflammation or ductal dilatation. Mild pancreatic atrophy. Spleen:  Normal size.  No focal lesions. Adrenals/Urinary Tract: The adrenal glands and kidneys are grossly normal. Small cysts are noted. Stomach/Bowel: The stomach is markedly distended. Moderate thickening of the distal gastric antrum/ pyloric region with suspected enhancement. Could not exclude tumor involvement of the stomach causing the gastric outlet obstruction. The  second portion the duodenum appears grossly normal. The visualized small bowel and colon are grossly normal. Vascular/Lymphatic: The aorta and branch vessels are grossly  normal. The portal and splenic veins appear patent. The intrahepatic IVC is compressed by tumor but appears patent. Suspect portable adenopathy/mass. Other: Large volume abdominal ascites, possibly malignant. No obvious omental disease but this would be much better evaluated with CT. Musculoskeletal: No obvious bone lesions. IMPRESSION: 1. Very limited examination due to patient motion. 2. Large central hepatic infiltrative mass likely obstructing the upper common bile duct and worrisome for cholangiocarcinoma. Percutaneous biliary drainage may be indicated along with ultrasound guided liver biopsy. 3. Numerous metastatic hepatic lesions. Suspect portal adenopathy and possible tumor obstructing the stomach. 4. Large volume abdominal ascites, possibly malignant. 5. Cholelithiasis and marked gallbladder wall thickening likely due to cystic duct obstruction. 6. Bilateral pleural effusions and bibasilar atelectasis. 7. CT chest abdomen pelvis may be helpful for further evaluation of metastatic disease. Electronically Signed   By: Marijo Sanes M.D.   On: 12/09/2016 11:01    Impression / Plan:   Mallissa Anez is a 83 y.o. y/o female with no known medical history who presents for evaluation of weakness and was found to have obstructive jaundice to liver masses. These masses are likely malignant in nature and are certainly causing significant jaundice. However, given her clinical condition and the extent of these masses, it is unlikely that she is a candidate for anything other than palliative treatment. Fortunately, the patient does not endorse significant symptoms of obstruction such as pain or pruritis and is not demonstrating signs of cholangitis. Since she and her family have decided to pursue palliative care, it is reasonable to discharge the patient to palliative care when possible and defer endoscopic alleviation of the obstruction, which would only be for palliative measures.   - discharge to hospice -  should the patient develop significant pruritis, cholangitis or abdominal pain prior to discharge, can pursue an ERCP for palliative stenting prior to discharge to hospice  Thank you for involving me in the care of this patient.     LOS: 4 days   Lisbeth Renshaw, MD  12/10/2016, 12:14 PM

## 2016-12-11 DIAGNOSIS — R748 Abnormal levels of other serum enzymes: Secondary | ICD-10-CM

## 2016-12-11 DIAGNOSIS — F17210 Nicotine dependence, cigarettes, uncomplicated: Secondary | ICD-10-CM

## 2016-12-11 DIAGNOSIS — C799 Secondary malignant neoplasm of unspecified site: Secondary | ICD-10-CM

## 2016-12-11 DIAGNOSIS — R110 Nausea: Secondary | ICD-10-CM

## 2016-12-11 DIAGNOSIS — R5381 Other malaise: Secondary | ICD-10-CM

## 2016-12-11 DIAGNOSIS — Z79899 Other long term (current) drug therapy: Secondary | ICD-10-CM

## 2016-12-11 DIAGNOSIS — C221 Intrahepatic bile duct carcinoma: Secondary | ICD-10-CM

## 2016-12-11 DIAGNOSIS — R1900 Intra-abdominal and pelvic swelling, mass and lump, unspecified site: Secondary | ICD-10-CM

## 2016-12-11 DIAGNOSIS — R5383 Other fatigue: Secondary | ICD-10-CM

## 2016-12-11 LAB — BASIC METABOLIC PANEL
Anion gap: 9 (ref 5–15)
BUN: 31 mg/dL — AB (ref 6–20)
CO2: 17 mmol/L — ABNORMAL LOW (ref 22–32)
Calcium: 7.5 mg/dL — ABNORMAL LOW (ref 8.9–10.3)
Chloride: 112 mmol/L — ABNORMAL HIGH (ref 101–111)
Creatinine, Ser: 0.97 mg/dL (ref 0.44–1.00)
GFR, EST NON AFRICAN AMERICAN: 54 mL/min — AB (ref >60)
Glucose, Bld: 77 mg/dL (ref 65–99)
POTASSIUM: 3.5 mmol/L (ref 3.5–5.1)
Sodium: 138 mmol/L (ref 135–145)

## 2016-12-11 LAB — CBC
HCT: 25.6 % — ABNORMAL LOW (ref 35.0–47.0)
Hemoglobin: 8.5 g/dL — ABNORMAL LOW (ref 12.0–16.0)
MCH: 30.4 pg (ref 26.0–34.0)
MCHC: 33.1 g/dL (ref 32.0–36.0)
MCV: 91.9 fL (ref 80.0–100.0)
Platelets: 374 10*3/uL (ref 150–440)
RBC: 2.79 MIL/uL — AB (ref 3.80–5.20)
RDW: 20.9 % — ABNORMAL HIGH (ref 11.5–14.5)
WBC: 17.7 10*3/uL — ABNORMAL HIGH (ref 3.6–11.0)

## 2016-12-11 LAB — CULTURE, BLOOD (ROUTINE X 2)
CULTURE: NO GROWTH
Culture: NO GROWTH

## 2016-12-11 MED ORDER — METOPROLOL TARTRATE 25 MG PO TABS: 25.0000 mg | ORAL_TABLET | Freq: Once | ORAL | Status: DC

## 2016-12-11 MED ORDER — METOPROLOL TARTRATE 50 MG PO TABS: 50.0000 mg | ORAL_TABLET | Freq: Two times a day (BID) | ORAL | Status: DC

## 2016-12-11 MED ORDER — ORAL CARE MOUTH RINSE: 15.0000 mL | Freq: Two times a day (BID) | OROMUCOSAL | 0 refills | Status: DC

## 2016-12-11 MED ORDER — PANTOPRAZOLE SODIUM 40 MG PO TBEC: 40.0000 mg | DELAYED_RELEASE_TABLET | Freq: Every day | ORAL | Status: DC

## 2016-12-11 MED ORDER — SENNOSIDES-DOCUSATE SODIUM 8.6-50 MG PO TABS: 1.0000 | ORAL_TABLET | Freq: Every evening | ORAL | Status: AC | PRN

## 2016-12-11 MED ORDER — CEPHALEXIN 500 MG PO CAPS: 500.0000 mg | ORAL_CAPSULE | Freq: Three times a day (TID) | ORAL | 0 refills | Status: DC

## 2016-12-11 MED ORDER — ENSURE ENLIVE PO LIQD: 237.0000 mL | Freq: Two times a day (BID) | ORAL | 12 refills | Status: DC

## 2016-12-11 MED ORDER — ADULT MULTIVITAMIN W/MINERALS CH: 1.0000 | ORAL_TABLET | Freq: Every day | ORAL | Status: DC

## 2016-12-11 MED ORDER — METOPROLOL TARTRATE 25 MG PO TABS
25.0000 mg | ORAL_TABLET | Freq: Two times a day (BID) | ORAL | Status: DC
  Administered 2016-12-11: 25 mg via ORAL
  Filled 2016-12-11: qty 1

## 2016-12-11 MED ORDER — METOPROLOL TARTRATE 50 MG PO TABS
50.0000 mg | ORAL_TABLET | Freq: Two times a day (BID) | ORAL | 0 refills | Status: DC
Start: 2016-12-11 — End: 2016-12-22

## 2016-12-11 NOTE — Consult Note (Signed)
Grace Summers  Telephone:(336) [phone] Fax:(336) [phone]  ID: Grace Summers OB: 07-12-1934  MR#: [id number]  [id number]  Patient Care Team: No Pcp Per Patient as PCP - General (General Practice)  CHIEF COMPLAINT: Weakness and fatigue, likely metastatic malignancy.  INTERVAL HISTORY: Patient is a 83 year old female who presented to the emergency room with increasing weakness and fatigue and declining performance status. Subsequent workup revealed a large liver mass with multiple hepatic lesions consistent with metastatic disease. Currently, patient only complains of mild abdominal distention. She has no neurologic complaints. She does not complain of pain. She has a good appetite and denies weight loss. She has no chest pain or shortness of breath. She denies any nausea, vomiting, constipation, or diarrhea. She has no urinary complaints. Patient otherwise feels well and offers no further specific complaints.  REVIEW OF SYSTEMS:   Review of Systems  Constitutional: Positive for malaise/fatigue. Negative for fever and weight loss.  Respiratory: Negative.  Negative for cough and shortness of breath.   Cardiovascular: Negative.  Negative for chest pain and leg swelling.  Gastrointestinal: Positive for vomiting. Negative for abdominal pain, blood in stool, constipation, diarrhea, melena and nausea.  Neurological: Positive for weakness.  Psychiatric/Behavioral: Negative.  The patient is not nervous/anxious.     As per HPI. Otherwise, a complete review of systems is negative.  PAST MEDICAL HISTORY: History reviewed. No pertinent past medical history.  PAST SURGICAL HISTORY: Past Surgical History:  Procedure Laterality Date  . CATARACT EXTRACTION      FAMILY HISTORY: Family History  Problem Relation Age of Onset  . Breast cancer Mother   . Liver cancer Father     ADVANCED DIRECTIVES (Y/N):  @ADVDIR @  HEALTH MAINTENANCE: Social History  Substance Use Topics    . Smoking status: Current Every Day Smoker    Packs/day: 0.50    Years: 60.00    Types: Cigarettes  . Smokeless tobacco: Never Used  . Alcohol use No     Colonoscopy:  PAP:  Bone density:  Lipid panel:  No Known Allergies  Current Facility-Administered Medications  Medication Dose Route Frequency Provider Last Rate Last Dose  . 0.9 %  sodium chloride infusion   Intravenous Continuous Demetrios Loll, MD 75 mL/hr at 12/10/16 2337    . acetaminophen (TYLENOL) tablet 650 mg  650 mg Oral Q4H PRN Demetrios Loll, MD   650 mg at 12/10/16 1552   Or  . acetaminophen (TYLENOL) solution 650 mg  650 mg Per Tube Q4H PRN Demetrios Loll, MD       Or  . acetaminophen (TYLENOL) suppository 650 mg  650 mg Rectal Q4H PRN Demetrios Loll, MD      . cephALEXin (KEFLEX) capsule 500 mg  500 mg Oral Q8H Nicholes Mango, MD   500 mg at 12/11/16 0542  . feeding supplement (ENSURE ENLIVE) (ENSURE ENLIVE) liquid 237 mL  237 mL Oral BID BM [name], MD   237 mL at 12/10/16 1000  . MEDLINE mouth rinse  15 mL Mouth Rinse BID [name], MD   15 mL at 12/10/16 2136  . multivitamin with minerals tablet 1 tablet  1 tablet Oral Daily [name], MD   1 tablet at 12/11/16 1012  . pantoprazole (PROTONIX) EC tablet 40 mg  40 mg Oral Daily [name], MD   40 mg at 12/11/16 1012  . senna-docusate (Senokot-S) tablet 1 tablet  1 tablet Oral QHS PRN Demetrios Loll, MD        OBJECTIVE: Vitals:  12/10/16 2312 12/11/16 0459  BP: (!) 141/71 (!) 154/49  Pulse: 90 (!) 49  Resp: 20 20  Temp: 97.9 F (36.6 C) 98 F (36.7 C)     Body mass index is 16.21 kg/m.    ECOG FS:1 - Symptomatic but completely ambulatory  General: Well-developed, well-nourished, no acute distress. Eyes: Pink conjunctiva, anicteric sclera. HEENT: Normocephalic, moist mucous membranes, clear oropharnyx. Lungs: Clear to auscultation bilaterally. Heart: Regular rate and rhythm. No rubs, murmurs, or gallops. Abdomen: Soft, nontender, mildly distended. Musculoskeletal: No  edema, cyanosis, or clubbing. Neuro: Alert, answering all questions appropriately. Cranial nerves grossly intact. Skin: No rashes or petechiae noted. Psych: Normal affect. Lymphatics: No cervical, calvicular, axillary or inguinal LAD.   LAB RESULTS:  Lab Results  Component Value Date   NA 138 12/11/2016   K 3.5 12/11/2016   CL 112 (H) 12/11/2016   CO2 17 (L) 12/11/2016   GLUCOSE 77 12/11/2016   BUN 31 (H) 12/11/2016   CREATININE 0.97 12/11/2016   CALCIUM 7.5 (L) 12/11/2016   PROT 6.0 (L) 12/06/2016   ALBUMIN 1.5 (L) 12/06/2016   AST 159 (H) 12/06/2016   ALT 76 (H) 12/06/2016   ALKPHOS 431 (H) 12/06/2016   BILITOT 11.3 (H) 12/06/2016   GFRNONAA 54 (L) 12/11/2016   GFRAA >60 12/11/2016    Lab Results  Component Value Date   WBC 17.7 (H) 12/11/2016   NEUTROABS 20.8 (H) 12/06/2016   HGB 8.5 (L) 12/11/2016   HCT 25.6 (L) 12/11/2016   MCV 91.9 12/11/2016   PLT 374 12/11/2016     STUDIES: Dg Chest 1 View  Result Date: 12/06/2016 CLINICAL DATA:  Weight loss EXAM: CHEST 1 VIEW COMPARISON:  None. FINDINGS: There is slight scarring in each upper lobe and right base regions. There is no edema or consolidation. Heart size and pulmonary vascularity are normal. No adenopathy. Calcification is noted in the left carotid artery. IMPRESSION: Scattered areas of mild scarring. No edema or consolidation. There is calcification, incompletely visualized, in the left carotid artery. Electronically Signed   By: Lowella Grip III M.D.   On: 12/06/2016 10:49   Ct Head Wo Contrast  Result Date: 12/06/2016 CLINICAL DATA:  83 year old female with right lower extremity weakness for 1 week. Initial encounter. EXAM: CT HEAD WITHOUT CONTRAST TECHNIQUE: Contiguous axial images were obtained from the base of the skull through the vertex without intravenous contrast. COMPARISON:  None. FINDINGS: Brain: Extra-axial CSF over the frontal convexities appears related to age related cerebral volume loss.  There is patchy hypodensity in the left pons (series 2, image 12). There is relatively little deep gray matter and cerebral white matter hypodensity elsewhere ; there is some involvement of the right basal ganglia. No superimposed cortical encephalomalacia identified. No midline shift, ventriculomegaly, mass effect, evidence of mass lesion, intracranial hemorrhage or evidence of cortically based acute infarction. Vascular: Calcified atherosclerosis at the skull base. No suspicious intracranial vascular hyperdensity. Skull: Osteopenia. Hyperostosis of the calvarium. No acute osseous abnormality identified. Sinuses/Orbits: Clear. Other: No acute orbit or scalp soft tissue finding. IMPRESSION: 1. Age indeterminate hypodensity in the left thalamus is suspicious for an acute to subacute lacunar infarct in this clinical setting. No associated hemorrhage or mass effect. 2. Otherwise suspect relatively mild for age chronic small vessel disease. Electronically Signed   By: Genevie Ann M.D.   On: 12/06/2016 10:39   Mr Brain Wo Contrast  Result Date: 12/06/2016 CLINICAL DATA:  RIGHT-sided weakness for 10 days, decreased appetite and weight loss  for a few days. Urinary tract infection. Follow-up stroke. EXAM: MRI HEAD WITHOUT CONTRAST MRA HEAD WITHOUT CONTRAST TECHNIQUE: Multiplanar, multiecho pulse sequences of the brain and surrounding structures were obtained without intravenous contrast. Angiographic images of the head were obtained using MRA technique without contrast. COMPARISON:  CT HEAD December 06, 2016 1028 hours FINDINGS: MRI HEAD FINDINGS BRAIN: No reduced diffusion to suggest acute ischemia with particular attention to LEFT thalamus. No susceptibility artifact to suggest hemorrhage. The ventricles and sulci are normal for patient's age. 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 A few scattered subcentimeter supratentorial white matter FLAIR T2 hyperintensities compatible with mild chronic small  vessel ischemic disease, less than expected for age. No suspicious parenchymal signal, masses or mass effect. No abnormal extra-axial fluid collections. No extra-axial masses though, contrast enhanced sequences would be more sensitive. VASCULAR: Normal major intracranial vascular flow voids present at skull base. SKULL AND UPPER CERVICAL SPINE: No abnormal sellar expansion. No suspicious calvarial bone marrow signal. Craniocervical junction maintained. SINUSES/ORBITS: The mastoid air-cells and included paranasal sinuses are well-aerated. Status post bilateral ocular lens implants. The included ocular globes and orbital contents are non-suspicious. OTHER: Patient is edentulous. MRA HEAD FINDINGS- mildly motion degraded examination. ANTERIOR CIRCULATION: Flow related enhancement of the included cervical, petrous, cavernous and supraclinoid internal carotid arteries. Moderate stenosis RIGHT anterior genu of the internal carotid artery, Patent anterior communicating artery. Occluded proximal RIGHT A1 segment, minimal distal retrograde flow. Patent anterior communicating artery and bilateral A2 segments and distal ACA's. Flow related enhancement bilateral MCA arteries. Moderate tandem stenoses bilateral MCA, mild luminal irregularity bilateral ACA. POSTERIOR CIRCULATION: RIGHT vertebral artery is dominant. Basilar artery is patent, with normal flow related enhancement of the main branch vessels. Flow related enhancement of the posterior cerebral arteries. Tiny LEFT posterior communicating artery present. Multifocal moderate stenoses. ANATOMIC VARIANTS: Fetal origin RIGHT posterior cerebral artery. IMPRESSION: MRI HEAD: No acute intracranial process, specifically no acute ischemia. 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 MRA HEAD: No emergent large vessel occlusion on this mildly motion degraded examination. Multifocal anterior and posterior circulation moderate  stenoses compatible with atherosclerosis. Chronically occluded proximal RIGHT A1 segment with patent anterior communicating artery. Electronically Signed   By: Elon Alas M.D.   On: 12/06/2016 16:46   US Carotid Bilateral (at [facility] Only)  Result Date: 12/06/2016 CLINICAL DATA:  Right-sided weakness for 10 days EXAM: BILATERAL CAROTID DUPLEX ULTRASOUND TECHNIQUE: Pearline Cables scale imaging, color Doppler and duplex ultrasound were performed of bilateral carotid and vertebral arteries in the neck. COMPARISON:  None. FINDINGS: Criteria: Quantification of carotid stenosis is based on velocity parameters that correlate the residual internal carotid diameter with NASCET-based stenosis levels, using the diameter of the distal internal carotid lumen as the denominator for stenosis measurement. The following velocity measurements were obtained: RIGHT ICA:  101 cm/sec CCA:  88 cm/sec SYSTOLIC ICA/CCA RATIO:  1.1 DIASTOLIC ICA/CCA RATIO:  2.6 ECA:  85 cm/sec LEFT ICA:  94 cm/sec CCA:  93 cm/sec SYSTOLIC ICA/CCA RATIO:  1.0 DIASTOLIC ICA/CCA RATIO:  1.4 ECA:  187 cm/sec RIGHT CAROTID ARTERY: Moderate calcified plaque in the bulb. Low resistance internal carotid Doppler pattern. RIGHT VERTEBRAL ARTERY:  Antegrade. LEFT CAROTID ARTERY: Moderate mixed plaque along the wall of the bulb. Low resistance internal carotid Doppler pattern is preserved. LEFT VERTEBRAL ARTERY:  Antegrade. IMPRESSION: Less than 50% stenosis in the right and left internal carotid arteries. Electronically Signed   By: Arnell Sieving  Hoss M.D.   On: 12/06/2016 16:05   Mr 3d Recon At Scanner  Result Date: 12/09/2016 CLINICAL DATA:  Followup abnormal ultrasound examination. EXAM: MRI ABDOMEN WITHOUT AND WITH CONTRAST (INCLUDING MRCP) TECHNIQUE: Multiplanar multisequence MR imaging of the abdomen was performed both before and after the administration of intravenous contrast. Heavily T2-weighted images of the biliary and pancreatic ducts were obtained, and  three-dimensional MRCP images were rendered by post processing. CONTRAST:  64mL MULTIHANCE GADOBENATE DIMEGLUMINE 529 MG/ML IV SOLN COMPARISON:  Ultrasound 12/07/2016 FINDINGS: Examination is quite limited due to patient motion. Lower chest: Bilateral pleural effusions, right greater than left. Associated overlying atelectasis. No pericardial effusion. No obvious lung lesions. Hepatobiliary: Large irregular enhancing central liver mass with numerous other smaller hepatic lesions in both lobes. There is also marked intrahepatic biliary dilatation without abrupt cut off of the common hepatic or upper common bile duct. Findings suspicious for cholangiocarcinoma and metastatic disease. Stone filled gallbladder with severe surrounding inflammations/fluid. Pancreas: No mass, inflammation or ductal dilatation. Mild pancreatic atrophy. Spleen:  Normal size.  No focal lesions. Adrenals/Urinary Tract: The adrenal glands and kidneys are grossly normal. Small cysts are noted. Stomach/Bowel: The stomach is markedly distended. Moderate thickening of the distal gastric antrum/ pyloric region with suspected enhancement. Could not exclude tumor involvement of the stomach causing the gastric outlet obstruction. The second portion the duodenum appears grossly normal. The visualized small bowel and colon are grossly normal. Vascular/Lymphatic: The aorta and branch vessels are grossly normal. The portal and splenic veins appear patent. The intrahepatic IVC is compressed by tumor but appears patent. Suspect portable adenopathy/mass. Other: Large volume abdominal ascites, possibly malignant. No obvious omental disease but this would be much better evaluated with CT. Musculoskeletal: No obvious bone lesions. IMPRESSION: 1. Very limited examination due to patient motion. 2. Large central hepatic infiltrative mass likely obstructing the upper common bile duct and worrisome for cholangiocarcinoma. Percutaneous biliary drainage may be indicated  along with ultrasound guided liver biopsy. 3. Numerous metastatic hepatic lesions. Suspect portal adenopathy and possible tumor obstructing the stomach. 4. Large volume abdominal ascites, possibly malignant. 5. Cholelithiasis and marked gallbladder wall thickening likely due to cystic duct obstruction. 6. Bilateral pleural effusions and bibasilar atelectasis. 7. CT chest abdomen pelvis may be helpful for further evaluation of metastatic disease. Electronically Signed   By: Marijo Sanes M.D.   On: 12/09/2016 11:01   Mr Abdomen Mrcp Moise Boring Contast  Result Date: 12/09/2016 CLINICAL DATA:  Followup abnormal ultrasound examination. EXAM: MRI ABDOMEN WITHOUT AND WITH CONTRAST (INCLUDING MRCP) TECHNIQUE: Multiplanar multisequence MR imaging of the abdomen was performed both before and after the administration of intravenous contrast. Heavily T2-weighted images of the biliary and pancreatic ducts were obtained, and three-dimensional MRCP images were rendered by post processing. CONTRAST:  10mL MULTIHANCE GADOBENATE DIMEGLUMINE 529 MG/ML IV SOLN COMPARISON:  Ultrasound 12/07/2016 FINDINGS: Examination is quite limited due to patient motion. Lower chest: Bilateral pleural effusions, right greater than left. Associated overlying atelectasis. No pericardial effusion. No obvious lung lesions. Hepatobiliary: Large irregular enhancing central liver mass with numerous other smaller hepatic lesions in both lobes. There is also marked intrahepatic biliary dilatation without abrupt cut off of the common hepatic or upper common bile duct. Findings suspicious for cholangiocarcinoma and metastatic disease. Stone filled gallbladder with severe surrounding inflammations/fluid. Pancreas: No mass, inflammation or ductal dilatation. Mild pancreatic atrophy. Spleen:  Normal size.  No focal lesions. Adrenals/Urinary Tract: The adrenal glands and kidneys are grossly normal. Small cysts are noted. Stomach/Bowel:  The stomach is markedly  distended. Moderate thickening of the distal gastric antrum/ pyloric region with suspected enhancement. Could not exclude tumor involvement of the stomach causing the gastric outlet obstruction. The second portion the duodenum appears grossly normal. The visualized small bowel and colon are grossly normal. Vascular/Lymphatic: The aorta and branch vessels are grossly normal. The portal and splenic veins appear patent. The intrahepatic IVC is compressed by tumor but appears patent. Suspect portable adenopathy/mass. Other: Large volume abdominal ascites, possibly malignant. No obvious omental disease but this would be much better evaluated with CT. Musculoskeletal: No obvious bone lesions. IMPRESSION: 1. Very limited examination due to patient motion. 2. Large central hepatic infiltrative mass likely obstructing the upper common bile duct and worrisome for cholangiocarcinoma. Percutaneous biliary drainage may be indicated along with ultrasound guided liver biopsy. 3. Numerous metastatic hepatic lesions. Suspect portal adenopathy and possible tumor obstructing the stomach. 4. Large volume abdominal ascites, possibly malignant. 5. Cholelithiasis and marked gallbladder wall thickening likely due to cystic duct obstruction. 6. Bilateral pleural effusions and bibasilar atelectasis. 7. CT chest abdomen pelvis may be helpful for further evaluation of metastatic disease. Electronically Signed   By: Marijo Sanes M.D.   On: 12/09/2016 11:01   Mr Jodene Nam Head/brain [id number] Cm  Result Date: 12/06/2016 CLINICAL DATA:  RIGHT-sided weakness for 10 days, decreased appetite and weight loss for a few days. Urinary tract infection. Follow-up stroke. EXAM: MRI HEAD WITHOUT CONTRAST MRA HEAD WITHOUT CONTRAST TECHNIQUE: Multiplanar, multiecho pulse sequences of the brain and surrounding structures were obtained without intravenous contrast. Angiographic images of the head were obtained using MRA technique without contrast. COMPARISON:  CT HEAD  December 06, 2016 1028 hours FINDINGS: MRI HEAD FINDINGS BRAIN: No reduced diffusion to suggest acute ischemia with particular attention to LEFT thalamus. No susceptibility artifact to suggest hemorrhage. The ventricles and sulci are normal for patient's age. 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 A few scattered subcentimeter supratentorial white matter FLAIR T2 hyperintensities compatible with mild chronic small vessel ischemic disease, less than expected for age. No suspicious parenchymal signal, masses or mass effect. No abnormal extra-axial fluid collections. No extra-axial masses though, contrast enhanced sequences would be more sensitive. VASCULAR: Normal major intracranial vascular flow voids present at skull base. SKULL AND UPPER CERVICAL SPINE: No abnormal sellar expansion. No suspicious calvarial bone marrow signal. Craniocervical junction maintained. SINUSES/ORBITS: The mastoid air-cells and included paranasal sinuses are well-aerated. Status post bilateral ocular lens implants. The included ocular globes and orbital contents are non-suspicious. OTHER: Patient is edentulous. MRA HEAD FINDINGS- mildly motion degraded examination. ANTERIOR CIRCULATION: Flow related enhancement of the included cervical, petrous, cavernous and supraclinoid internal carotid arteries. Moderate stenosis RIGHT anterior genu of the internal carotid artery, Patent anterior communicating artery. Occluded proximal RIGHT A1 segment, minimal distal retrograde flow. Patent anterior communicating artery and bilateral A2 segments and distal ACA's. Flow related enhancement bilateral MCA arteries. Moderate tandem stenoses bilateral MCA, mild luminal irregularity bilateral ACA. POSTERIOR CIRCULATION: RIGHT vertebral artery is dominant. Basilar artery is patent, with normal flow related enhancement of the main branch vessels. Flow related enhancement of the posterior cerebral arteries. Tiny LEFT posterior  communicating artery present. Multifocal moderate stenoses. ANATOMIC VARIANTS: Fetal origin RIGHT posterior cerebral artery. IMPRESSION: MRI HEAD: No acute intracranial process, specifically no acute ischemia. 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 MRA HEAD: No emergent large vessel occlusion on this mildly motion degraded examination. Multifocal anterior and posterior circulation  moderate stenoses compatible with atherosclerosis. Chronically occluded proximal RIGHT A1 segment with patent anterior communicating artery. Electronically Signed   By: Elon Alas M.D.   On: 12/06/2016 16:46   US Abdomen Limited Ruq  Result Date: 12/07/2016 CLINICAL DATA:  Jaundice EXAM: US ABDOMEN LIMITED - RIGHT UPPER QUADRANT COMPARISON:  None FINDINGS: Gallbladder: A normal gallbladder is not visualized. Questionable gallbladder filled with shadowing calculi and sludge versus a bowel loop adjacent to the liver. No sonographic Murphy sign. Common bile duct: Diameter: 10 mm diameter, dilated Liver: Intrahepatic biliary dilatation. Nodular margins with heterogeneous parenchymal echogenicity. Multiple hypoechoic masses suspicious for metastatic disease. Largest nodule measures 2.5 x 3.1 x 4.2 cm in the LEFT lobe. Small amounts of scattered ascites throughout abdomen. Due to shadowing from gallstones and bowel gas, unable to identify a discrete mass obstructing the extrahepatic biliary tree at the porta hepatis or pancreatic head. IMPRESSION: A normal appearing gallbladder is not visualized. A structure with shadowing echogenic foci adjacent to the liver edge may represent a gallbladder filled with shadowing calculi and sludge though a bowel loop is not entirely excluded; recommend correlation with surgical history. Nodular liver with multiple hepatic masses suspicious for hepatic metastatic disease. Intrahepatic biliary dilatation and proximal extrahepatic biliary  dilatation, though cause of the obstruction is not identified. Further assessment by CT abdomen with contrast or MR imaging with and without contrast recommended to assess liver, extrahepatic biliary tree, and gallbladder. Electronically Signed   By: Lavonia Dana M.D.   On: 12/07/2016 11:45    ASSESSMENT: Weakness and fatigue, likely metastatic malignancy.  PLAN:    1. Metastatic malignancy: MRI results reviewed independently. Agree this is highly suspicious for underlying cholangiocarcinoma. After lengthy discussion with patient's daughter yesterday, they have indicated that they likely would not pursue any aggressive treatments. Given her advanced age, as well as elevated liver enzymes and bilirubin, treatment with chemotherapy would be difficult. Tumor markers have been ordered for completeness. Patient's daughter has requested outpatient follow-up in the cancer Center to further discuss patient's underlying prognosis. Please ensure patient has follow-up within 1 week.  Appreciate consult, call with questions.    Lloyd Huger, MD   12/11/2016 10:45 AM

## 2016-12-11 NOTE — Clinical Social Work Note (Signed)
Patient to dc to [facility] via non-emergent EMS. The facility is aware. The patient's daughter is in agreement and asked that she be called when EMS arrives. This message was delivered to the RN when the packet was delivered. The facility has received all pertinent paperwork. CSW signing off.  Santiago Bumpers, MSW, Latanya Presser [phone]

## 2016-12-11 NOTE — Discharge Instructions (Signed)
Follow-up with primary care physician at the facility in 3 days Follow-up with oncologist Dr. Grayland Ormond in a week Palliative care to follow-up with the patient at the facility

## 2016-12-11 NOTE — Discharge Summary (Signed)
[facility] NAME: Grace Summers    MR#:  [id number]  DATE OF BIRTH:  [birth year]  DATE OF ADMISSION:  12/06/2016 ADMITTING PHYSICIAN: Demetrios Loll, MD  DATE OF DISCHARGE: 12/11/16 PRIMARY CARE PHYSICIAN: No PCP Per Patient    ADMISSION DIAGNOSIS:  Sepsis, due to unspecified organism ([place]) [A41.9] Cerebrovascular accident (CVA), unspecified mechanism ([place]) [I63.9]  DISCHARGE DIAGNOSIS:  Sepsis secondary to Escherichia coli UTI Obstructive jaundice probably cholangiocarcinoma GI bleed Hypertension  SECONDARY DIAGNOSIS:  History reviewed. No pertinent past medical history.  HOSPITAL COURSE:  FannieJohnsonis a 83 y.o.femalewithNo past medical history. Was sent to ED by family member due to above chief complaints. The patient has had significant weight loss for the past a few days, he has decreased appetite and generalized weakness. She complains right side weakness for 10 days. But she denies any slurred speech, dysphagia or incontinence. She denies any fever or chills, no cough, phlegm or shortness of breath. Please review history and physical for details  * Sepsis due to E.coli UTI. - Treated with Rocephin  Pansensitive discontinue IV Rocephin and start patient on by mouth Keflex  *Jaundice: obstructive in nature -USG shows Nodular liver with multiple hepatic masses suspicious for hepatic metastatic disease with the high suspicion for cholangiocarcinoma -MRCP -Large central hepatic infiltrative mass likely obstructing the upper common bile duct and worrisome for cholangiocarcinoma. Numerous metastatic hepatic lesions were noticed suspect portal adenopathy and possible tumor obstructing the stomach. Large volume abdominal ascites possibly malignant -Daughter is refusing any aggressive measures. Discussed with Dr. Grayland Ormond oncologist who has recommended outpatient follow-up in a week after discharge. Daughter is agreeable  with that - GI and oncology is recommending palliative care as patient's daughter is refusing aggressive measures at this time and patient is not symptomatic -Daughter has discussed with oncologist Dr. Grayland Ormond over phone yesterday  *right sided weakness -w/u for stroke Negative -old cva on CT head  *Hypertension blood pressure is elevated  patient is started on metoprolol 50 mg twice a day. Primary care physician to follow and titrate as needed  *GI bleed: ?upper -dark marron blood clots -asa stopped -Status post 1 unit of blood transfusion. Hemoglobin is stable at 8.7.  -GI signed off as there is no active bleeding and daughter is not considering aggressive measures at this time including any procedures   *Acute renal failuredue to dehydration. Improved with IV fluids. Creatinine at 0.97 today  * Lactic acidosis, treatment as above, follow-up lactic acid level 1.6-2.4  * Failure to thrive. Dietitian consulted. Ensure enlive 2 times daily  * Acute blood loss Anemia: and underlying chronic disease and malnutrition. Hb improved to 8.7 (s/p 1 PRBC transfusion)  *Tobacco abuse. Smoking cessation was counseled for more than 4 minutes, nicotine patch.   PT is recommending skilled nursing facility. Bed [facility] available at [facility]. Daughter is agreeable with the transfer   Case discussed with Care Management/Social Worker. Management plans discussed with the patient, family (d/w daughter Grace Summers via phone [phone]) andShe agreeable to discharge patient to skilled nursing facility at [facility] today  CODE STATUS: DNR  DVT Prophylaxis: SCD   DISCHARGE CONDITIONS:   fair  CONSULTS OBTAINED:  Treatment Team:  Alexis Goodell, MD Lisbeth Renshaw, MD Lloyd Huger, MD   PROCEDURES  none  DRUG ALLERGIES:  No Known Allergies  DISCHARGE MEDICATIONS:   Current Discharge Medication List    START taking these medications   Details  cephALEXin  (  KEFLEX) 500 MG capsule Take 1 capsule (500 mg total) by mouth every 8 (eight) hours. Qty: 15 capsule, Refills: 0    feeding supplement, ENSURE ENLIVE, (ENSURE ENLIVE) LIQD Take 237 mLs by mouth 2 (two) times daily between meals. Qty: 237 mL, Refills: 12    metoprolol (LOPRESSOR) 50 MG tablet Take 1 tablet (50 mg total) by mouth 2 (two) times daily. Qty: 60 tablet, Refills: 0    mouth rinse LIQD solution 15 mLs by Mouth Rinse route 2 (two) times daily. Qty: 118 mL, Refills: 0    Multiple Vitamin (MULTIVITAMIN WITH MINERALS) TABS tablet Take 1 tablet by mouth daily.    pantoprazole (PROTONIX) 40 MG tablet Take 1 tablet (40 mg total) by mouth daily.    senna-docusate (SENOKOT-S) 8.6-50 MG tablet Take 1 tablet by mouth at bedtime as needed for mild constipation.         DISCHARGE INSTRUCTIONS:  Follow-up with primary care physician at the facility in 3 days Follow-up with oncologist Dr. Grayland Ormond in a week Palliative care to follow-up with the patient at the facility   DIET:  Low-salt diet with supplements ensure enlive  DISCHARGE CONDITION:  Fair  ACTIVITY:  Activity as tolerated per PT  OXYGEN:  Home Oxygen: No.   Oxygen Delivery: room air  DISCHARGE LOCATION:  nursing home   If you experience worsening of your admission symptoms, develop shortness of breath, life threatening emergency, suicidal or homicidal thoughts you must seek medical attention immediately by calling 911 or calling your MD immediately  if symptoms less severe.  You Must read complete instructions/literature along with all the possible adverse reactions/side effects for all the Medicines you take and that have been prescribed to you. Take any new Medicines after you have completely understood and accpet all the possible adverse reactions/side effects.   Please note  You were cared for by a hospitalist during your hospital stay. If you have any questions about your discharge medications or the care  you received while you were in the hospital after you are discharged, you can call the unit and asked to speak with the hospitalist on call if the hospitalist that took care of you is not available. Once you are discharged, your primary care physician will handle any further medical issues. Please note that NO REFILLS for any discharge medications will be authorized once you are discharged, as it is imperative that you return to your primary care physician (or establish a relationship with a primary care physician if you do not have one) for your aftercare needs so that they can reassess your need for medications and monitor your lab values.     Today  Chief Complaint  Patient presents with  . Weakness  . Leg Swelling  . Failure To Thrive   Patient is doing fine. Tolerating diet. Denies any abdominal pain nausea or vomiting. Agreeable with the discharged to skilled nursing facility  ROS:  CONSTITUTIONAL: Denies fevers, chills. Denies any fatigue, weakness.  EYES: Denies blurry vision, double vision, eye pain. EARS, NOSE, THROAT: Denies tinnitus, ear pain, hearing loss. RESPIRATORY: Denies cough, wheeze, shortness of breath.  CARDIOVASCULAR: Denies chest pain, palpitations, edema.  GASTROINTESTINAL: Denies nausea, vomiting, diarrhea, abdominal pain. Denies bright red blood per rectum. GENITOURINARY: Denies dysuria, hematuria. ENDOCRINE: Denies nocturia or thyroid problems. HEMATOLOGIC AND LYMPHATIC: Denies easy bruising or bleeding. SKIN: Denies rash or lesion. MUSCULOSKELETAL: Denies pain in neck, back, shoulder, knees, hips or arthritic symptoms.  NEUROLOGIC: Denies paralysis, paresthesias.  PSYCHIATRIC: Denies  anxiety or depressive symptoms.   VITAL SIGNS:  Blood pressure (!) 163/78, pulse 97, temperature 98 F (36.7 C), temperature source Oral, resp. rate 20, height 5\' 3"  (1.6 m), weight 41.5 kg (91 lb 8 oz), SpO2 100 %.  I/O:    Intake/Output Summary (Last 24 hours) at  12/11/16 1333 Last data filed at 12/11/16 0700  Gross per 24 hour  Intake             1140 ml  Output                0 ml  Net             1140 ml    PHYSICAL EXAMINATION:  GENERAL:  83 y.o.-year-old patient lying in the bed with no acute distress.  EYES: Pupils equal, round, reactive to light and accommodation. No scleral icterus. Extraocular muscles intact.  HEENT: Head atraumatic, normocephalic. Oropharynx and nasopharynx clear.  NECK:  Supple, no jugular venous distention. No thyroid enlargement, no tenderness.  LUNGS: Normal breath sounds bilaterally, no wheezing, rales,rhonchi or crepitation. No use of accessory muscles of respiration.  CARDIOVASCULAR: S1, S2 normal. No murmurs, rubs, or gallops.  ABDOMEN: Soft, non-tender, Some distention is present but no rebound tenderness Bowel sounds present.  EXTREMITIES: No pedal edema, cyanosis, or clubbing.  NEUROLOGIC: Cranial nerves II through XII are intact. Muscle strength 5/5 in all extremities. Sensation intact. Gait not checked.  PSYCHIATRIC: The patient is alert and oriented x 3.  SKIN: No obvious rash, lesion, or ulcer.   DATA REVIEW:   CBC  Recent Labs Lab 12/11/16 0515  WBC 17.7*  HGB 8.5*  HCT 25.6*  PLT 374    Chemistries   Recent Labs Lab 12/06/16 1421  12/11/16 0515  NA  --   < > 138  K  --   < > 3.5  CL  --   < > 112*  CO2  --   < > 17*  GLUCOSE  --   < > 77  BUN  --   < > 31*  CREATININE 1.32*  < > 0.97  CALCIUM  --   < > 7.5*  AST 159*  --   --   ALT 76*  --   --   ALKPHOS 431*  --   --   BILITOT 11.3*  --   --   < > = values in this interval not displayed.  Cardiac Enzymes  Recent Labs Lab 12/06/16 0926  TROPONINI 0.03*    Microbiology Results  Results for orders placed or performed during the hospital encounter of 12/06/16  Blood culture (routine x 2)     Status: None (Preliminary result)   Collection Time: 12/06/16 11:32 AM  Result Value Ref Range Status   Specimen Description  BLOOD RIGHT FA  Final   Special Requests   Final    BOTTLES DRAWN AEROBIC AND ANAEROBIC AER 11ML ANA 11ML   Culture NO GROWTH 4 DAYS  Final   Report Status PENDING  Incomplete  Blood culture (routine x 2)     Status: None (Preliminary result)   Collection Time: 12/06/16 11:32 AM  Result Value Ref Range Status   Specimen Description BLOOD LEFT AC  Final   Special Requests   Final    BOTTLES DRAWN AEROBIC AND ANAEROBIC AER 12ML ANA 15ML   Culture NO GROWTH 4 DAYS  Final   Report Status PENDING  Incomplete  Urine culture     Status: Abnormal  Collection Time: 12/06/16 12:20 PM  Result Value Ref Range Status   Specimen Description URINE, RANDOM  Final   Special Requests NONE  Final   Culture >=100,000 COLONIES/mL ESCHERICHIA COLI (A)  Final   Report Status 12/09/2016 FINAL  Final   Organism ID, Bacteria ESCHERICHIA COLI (A)  Final      Susceptibility   Escherichia coli - MIC*    AMPICILLIN <=2 SENSITIVE Sensitive     CEFAZOLIN <=4 SENSITIVE Sensitive     CEFTRIAXONE <=1 SENSITIVE Sensitive     CIPROFLOXACIN <=0.25 SENSITIVE Sensitive     GENTAMICIN <=1 SENSITIVE Sensitive     IMIPENEM <=0.25 SENSITIVE Sensitive     NITROFURANTOIN <=16 SENSITIVE Sensitive     TRIMETH/SULFA <=20 SENSITIVE Sensitive     AMPICILLIN/SULBACTAM <=2 SENSITIVE Sensitive     Extended ESBL NEGATIVE Sensitive     * >=100,000 COLONIES/mL ESCHERICHIA COLI    RADIOLOGY:  Mr 3d Recon At Scanner  Result Date: 12/09/2016 CLINICAL DATA:  Followup abnormal ultrasound examination. EXAM: MRI ABDOMEN WITHOUT AND WITH CONTRAST (INCLUDING MRCP) TECHNIQUE: Multiplanar multisequence MR imaging of the abdomen was performed both before and after the administration of intravenous contrast. Heavily T2-weighted images of the biliary and pancreatic ducts were obtained, and three-dimensional MRCP images were rendered by post processing. CONTRAST:  71mL MULTIHANCE GADOBENATE DIMEGLUMINE 529 MG/ML IV SOLN COMPARISON:  Ultrasound  12/07/2016 FINDINGS: Examination is quite limited due to patient motion. Summers chest: Bilateral pleural effusions, right greater than left. Associated overlying atelectasis. No pericardial effusion. No obvious lung lesions. Hepatobiliary: Large irregular enhancing central liver mass with numerous other smaller hepatic lesions in both lobes. There is also marked intrahepatic biliary dilatation without abrupt cut off of the common hepatic or upper common bile duct. Findings suspicious for cholangiocarcinoma and metastatic disease. Stone filled gallbladder with severe surrounding inflammations/fluid. Pancreas: No mass, inflammation or ductal dilatation. Mild pancreatic atrophy. Spleen:  Normal size.  No focal lesions. Adrenals/Urinary Tract: The adrenal glands and kidneys are grossly normal. Small cysts are noted. Stomach/Bowel: The stomach is markedly distended. Moderate thickening of the distal gastric antrum/ pyloric region with suspected enhancement. Could not exclude tumor involvement of the stomach causing the gastric outlet obstruction. The second portion the duodenum appears grossly normal. The visualized small bowel and colon are grossly normal. Vascular/Lymphatic: The aorta and branch vessels are grossly normal. The portal and splenic veins appear patent. The intrahepatic IVC is compressed by tumor but appears patent. Suspect portable adenopathy/mass. Other: Large volume abdominal ascites, possibly malignant. No obvious omental disease but this would be much better evaluated with CT. Musculoskeletal: No obvious bone lesions. IMPRESSION: 1. Very limited examination due to patient motion. 2. Large central hepatic infiltrative mass likely obstructing the upper common bile duct and worrisome for cholangiocarcinoma. Percutaneous biliary drainage may be indicated along with ultrasound guided liver biopsy. 3. Numerous metastatic hepatic lesions. Suspect portal adenopathy and possible tumor obstructing the stomach.  4. Large volume abdominal ascites, possibly malignant. 5. Cholelithiasis and marked gallbladder wall thickening likely due to cystic duct obstruction. 6. Bilateral pleural effusions and bibasilar atelectasis. 7. CT chest abdomen pelvis may be helpful for further evaluation of metastatic disease. Electronically Signed   By: Marijo Sanes M.D.   On: 12/09/2016 11:01   Mr Abdomen Mrcp Moise Boring Contast  Result Date: 12/09/2016 CLINICAL DATA:  Followup abnormal ultrasound examination. EXAM: MRI ABDOMEN WITHOUT AND WITH CONTRAST (INCLUDING MRCP) TECHNIQUE: Multiplanar multisequence MR imaging of the abdomen was performed both before and after  the administration of intravenous contrast. Heavily T2-weighted images of the biliary and pancreatic ducts were obtained, and three-dimensional MRCP images were rendered by post processing. CONTRAST:  30mL MULTIHANCE GADOBENATE DIMEGLUMINE 529 MG/ML IV SOLN COMPARISON:  Ultrasound 12/07/2016 FINDINGS: Examination is quite limited due to patient motion. Summers chest: Bilateral pleural effusions, right greater than left. Associated overlying atelectasis. No pericardial effusion. No obvious lung lesions. Hepatobiliary: Large irregular enhancing central liver mass with numerous other smaller hepatic lesions in both lobes. There is also marked intrahepatic biliary dilatation without abrupt cut off of the common hepatic or upper common bile duct. Findings suspicious for cholangiocarcinoma and metastatic disease. Stone filled gallbladder with severe surrounding inflammations/fluid. Pancreas: No mass, inflammation or ductal dilatation. Mild pancreatic atrophy. Spleen:  Normal size.  No focal lesions. Adrenals/Urinary Tract: The adrenal glands and kidneys are grossly normal. Small cysts are noted. Stomach/Bowel: The stomach is markedly distended. Moderate thickening of the distal gastric antrum/ pyloric region with suspected enhancement. Could not exclude tumor involvement of the stomach  causing the gastric outlet obstruction. The second portion the duodenum appears grossly normal. The visualized small bowel and colon are grossly normal. Vascular/Lymphatic: The aorta and branch vessels are grossly normal. The portal and splenic veins appear patent. The intrahepatic IVC is compressed by tumor but appears patent. Suspect portable adenopathy/mass. Other: Large volume abdominal ascites, possibly malignant. No obvious omental disease but this would be much better evaluated with CT. Musculoskeletal: No obvious bone lesions. IMPRESSION: 1. Very limited examination due to patient motion. 2. Large central hepatic infiltrative mass likely obstructing the upper common bile duct and worrisome for cholangiocarcinoma. Percutaneous biliary drainage may be indicated along with ultrasound guided liver biopsy. 3. Numerous metastatic hepatic lesions. Suspect portal adenopathy and possible tumor obstructing the stomach. 4. Large volume abdominal ascites, possibly malignant. 5. Cholelithiasis and marked gallbladder wall thickening likely due to cystic duct obstruction. 6. Bilateral pleural effusions and bibasilar atelectasis. 7. CT chest abdomen pelvis may be helpful for further evaluation of metastatic disease. Electronically Signed   By: Marijo Sanes M.D.   On: 12/09/2016 11:01    EKG:   Orders placed or performed during the hospital encounter of 12/06/16  . ED EKG  . ED EKG  . EKG 12-Lead  . EKG 12-Lead      Management plans discussed with the patient, family and they are in agreement.  CODE STATUS:     Code Status Orders        Start     Ordered   12/08/16 1426  Do not attempt resuscitation (DNR)  Continuous    Question Answer Comment  In the event of cardiac or respiratory ARREST Do not call a "code blue"   In the event of cardiac or respiratory ARREST Do not perform Intubation, CPR, defibrillation or ACLS   In the event of cardiac or respiratory ARREST Use medication by any route,  position, wound care, and other measures to relive pain and suffering. May use oxygen, suction and manual treatment of airway obstruction as needed for comfort.      12/08/16 1425    Code Status History    Date Active Date Inactive Code Status Order ID Comments User Context   12/06/2016  2:01 PM 12/08/2016  2:25 PM Full Code [id number]  Demetrios Loll, MD Inpatient      TOTAL TIME TAKING CARE OF THIS PATIENT: 42 minutes.   Note: This dictation was prepared with Dragon dictation along with smaller phrase technology. Any transcriptional errors  that result from this process are unintentional.   @[facility] @  on 12/11/2016 at 1:33 PM  Between 7am to 6pm - Pager - [phone]  After 6pm go to [email or web address] - password [email or web address] [facility]  [facility]  Office  [phone]  CC: Primary care physician; No PCP Per Patient

## 2016-12-11 NOTE — Progress Notes (Signed)
[place] via non-emergent [facility]. Writer called report to accepting nurse at the [facility]. Pt being discharged on room air, IV removed, no distress noted. Ammie Dalton, RN

## 2016-12-12 ENCOUNTER — Telehealth: Payer: Self-pay | Admitting: *Deleted

## 2016-12-12 LAB — TYPE AND SCREEN
BLOOD PRODUCT EXPIRATION DATE: 201802132359
Blood Product Expiration Date: 201802132359
ISSUE DATE / TIME: 201801251535
UNIT TYPE AND RH: 5100
Unit Type and Rh: 5100

## 2016-12-12 LAB — CEA: CEA: 2037 ng/mL — AB (ref 0.0–4.7)

## 2016-12-12 LAB — CA 125: CA 125: 335.4 U/mL — AB (ref 0.0–38.1)

## 2016-12-12 LAB — CANCER ANTIGEN 19-9: CA 19 9: 7 U/mL (ref 0–35)

## 2016-12-12 NOTE — Telephone Encounter (Signed)
Asking to speak with Dr Grayland Ormond, States they spoke in the hospital Saturday. Went to [facility] yesterday and wants to discuss condition and recommendations

## 2016-12-12 NOTE — Telephone Encounter (Signed)
Per Dr Grayland Ormond, he had a 30 minute discussion Saturday with patient and daughter and again on Sunday with patient, will discuss at appt next week. Daughter advised of this and was unaware of an appt and would like to change date so that she can be present at appt. Transferred to scheduler

## 2016-12-13 LAB — CANCER ANTIGEN 27.29: CA 27.29: 408.5 U/mL — AB (ref 0.0–38.6)

## 2016-12-20 ENCOUNTER — Inpatient Hospital Stay: Payer: Medicare Other | Admitting: Oncology

## 2016-12-21 ENCOUNTER — Inpatient Hospital Stay (HOSPITAL_COMMUNITY): Payer: Medicare Other

## 2016-12-21 ENCOUNTER — Inpatient Hospital Stay (HOSPITAL_COMMUNITY)
Admission: AD | Admit: 2016-12-21 | Discharge: 2016-12-22 | DRG: 871 | Disposition: A | Discharge status: Skilled Nursing Facility | Payer: Medicare Other | Source: Other Acute Inpatient Hospital | Attending: Internal Medicine | Admitting: Internal Medicine

## 2016-12-21 DIAGNOSIS — R627 Adult failure to thrive: Secondary | ICD-10-CM | POA: Diagnosis not present

## 2016-12-21 DIAGNOSIS — K830 Cholangitis: Secondary | ICD-10-CM

## 2016-12-21 DIAGNOSIS — Z803 Family history of malignant neoplasm of breast: Secondary | ICD-10-CM | POA: Diagnosis not present

## 2016-12-21 DIAGNOSIS — C787 Secondary malignant neoplasm of liver and intrahepatic bile duct: Secondary | ICD-10-CM | POA: Diagnosis present

## 2016-12-21 DIAGNOSIS — Z720 Tobacco use: Secondary | ICD-10-CM | POA: Diagnosis not present

## 2016-12-21 DIAGNOSIS — R160 Hepatomegaly, not elsewhere classified: Secondary | ICD-10-CM

## 2016-12-21 DIAGNOSIS — K5641 Fecal impaction: Secondary | ICD-10-CM | POA: Diagnosis present

## 2016-12-21 DIAGNOSIS — A419 Sepsis, unspecified organism: Secondary | ICD-10-CM | POA: Diagnosis present

## 2016-12-21 DIAGNOSIS — E860 Dehydration: Secondary | ICD-10-CM | POA: Diagnosis present

## 2016-12-21 DIAGNOSIS — C221 Intrahepatic bile duct carcinoma: Secondary | ICD-10-CM | POA: Diagnosis present

## 2016-12-21 DIAGNOSIS — Z7189 Other specified counseling: Secondary | ICD-10-CM | POA: Diagnosis not present

## 2016-12-21 DIAGNOSIS — I959 Hypotension, unspecified: Secondary | ICD-10-CM | POA: Diagnosis present

## 2016-12-21 DIAGNOSIS — N183 Chronic kidney disease, stage 3 unspecified: Secondary | ICD-10-CM | POA: Diagnosis present

## 2016-12-21 DIAGNOSIS — K8309 Other cholangitis: Secondary | ICD-10-CM

## 2016-12-21 DIAGNOSIS — Z8673 Personal history of transient ischemic attack (TIA), and cerebral infarction without residual deficits: Secondary | ICD-10-CM

## 2016-12-21 DIAGNOSIS — K831 Obstruction of bile duct: Secondary | ICD-10-CM | POA: Diagnosis present

## 2016-12-21 DIAGNOSIS — I10 Essential (primary) hypertension: Secondary | ICD-10-CM | POA: Diagnosis present

## 2016-12-21 DIAGNOSIS — F0390 Unspecified dementia without behavioral disturbance: Secondary | ICD-10-CM | POA: Diagnosis not present

## 2016-12-21 DIAGNOSIS — Z800 Family history of malignant neoplasm of digestive organs: Secondary | ICD-10-CM | POA: Diagnosis not present

## 2016-12-21 DIAGNOSIS — E43 Unspecified severe protein-calorie malnutrition: Secondary | ICD-10-CM | POA: Diagnosis present

## 2016-12-21 DIAGNOSIS — D649 Anemia, unspecified: Secondary | ICD-10-CM | POA: Diagnosis present

## 2016-12-21 DIAGNOSIS — R64 Cachexia: Secondary | ICD-10-CM | POA: Diagnosis present

## 2016-12-21 DIAGNOSIS — N179 Acute kidney failure, unspecified: Secondary | ICD-10-CM | POA: Diagnosis present

## 2016-12-21 DIAGNOSIS — Z681 Body mass index (BMI) 19 or less, adult: Secondary | ICD-10-CM | POA: Diagnosis not present

## 2016-12-21 DIAGNOSIS — F17210 Nicotine dependence, cigarettes, uncomplicated: Secondary | ICD-10-CM | POA: Diagnosis present

## 2016-12-21 DIAGNOSIS — J9601 Acute respiratory failure with hypoxia: Secondary | ICD-10-CM

## 2016-12-21 DIAGNOSIS — Z66 Do not resuscitate: Secondary | ICD-10-CM | POA: Diagnosis present

## 2016-12-21 DIAGNOSIS — R197 Diarrhea, unspecified: Secondary | ICD-10-CM

## 2016-12-21 DIAGNOSIS — I129 Hypertensive chronic kidney disease with stage 1 through stage 4 chronic kidney disease, or unspecified chronic kidney disease: Secondary | ICD-10-CM | POA: Diagnosis present

## 2016-12-21 DIAGNOSIS — Z79899 Other long term (current) drug therapy: Secondary | ICD-10-CM

## 2016-12-21 DIAGNOSIS — Z515 Encounter for palliative care: Secondary | ICD-10-CM

## 2016-12-21 LAB — COMPREHENSIVE METABOLIC PANEL
ALT: 46 U/L (ref 14–54)
AST: 115 U/L — AB (ref 15–41)
Alkaline Phosphatase: 349 U/L — ABNORMAL HIGH (ref 38–126)
Anion gap: 14 (ref 5–15)
BUN: 59 mg/dL — AB (ref 6–20)
CO2: 11 mmol/L — AB (ref 22–32)
Calcium: 6.8 mg/dL — ABNORMAL LOW (ref 8.9–10.3)
Chloride: 115 mmol/L — ABNORMAL HIGH (ref 101–111)
Creatinine, Ser: 2.26 mg/dL — ABNORMAL HIGH (ref 0.44–1.00)
GFR calc Af Amer: 22 mL/min — ABNORMAL LOW (ref >60)
GFR calc non Af Amer: 19 mL/min — ABNORMAL LOW (ref >60)
GLUCOSE: 131 mg/dL — AB (ref 65–99)
POTASSIUM: 4.2 mmol/L (ref 3.5–5.1)
SODIUM: 140 mmol/L (ref 135–145)
TOTAL PROTEIN: 5 g/dL — AB (ref 6.5–8.1)
Total Bilirubin: 10.5 mg/dL — ABNORMAL HIGH (ref 0.3–1.2)

## 2016-12-21 LAB — GLUCOSE, CAPILLARY
GLUCOSE-CAPILLARY: 104 mg/dL — AB (ref 65–99)
GLUCOSE-CAPILLARY: 129 mg/dL — AB (ref 65–99)
GLUCOSE-CAPILLARY: 135 mg/dL — AB (ref 65–99)
GLUCOSE-CAPILLARY: 135 mg/dL — AB (ref 65–99)
GLUCOSE-CAPILLARY: 126 mg/dL — AB (ref 65–99)
Glucose-Capillary: 131 mg/dL — ABNORMAL HIGH (ref 65–99)
Glucose-Capillary: 38 mg/dL — CL (ref 65–99)
Glucose-Capillary: 121 mg/dL — ABNORMAL HIGH (ref 65–99)
Glucose-Capillary: 76 mg/dL (ref 65–99)
Glucose-Capillary: 139 mg/dL — ABNORMAL HIGH (ref 65–99)
Glucose-Capillary: 138 mg/dL — ABNORMAL HIGH (ref 65–99)
Glucose-Capillary: 131 mg/dL — ABNORMAL HIGH (ref 65–99)

## 2016-12-21 LAB — BLOOD GAS, ARTERIAL
ACID-BASE DEFICIT: 14.6 mmol/L — AB (ref 0.0–2.0)
Bicarbonate: 10.6 mmol/L — ABNORMAL LOW (ref 20.0–28.0)
DRAWN BY: 418751
FIO2: 44
O2 SAT: 92.1 %
PATIENT TEMPERATURE: 98.4
PCO2 ART: 22.1 mmHg — AB (ref 32.0–48.0)
pH, Arterial: 7.303 — ABNORMAL LOW (ref 7.350–7.450)
pO2, Arterial: 75.2 mmHg — ABNORMAL LOW (ref 83.0–108.0)

## 2016-12-21 LAB — APTT: aPTT: 44 seconds — ABNORMAL HIGH (ref 24–36)

## 2016-12-21 LAB — LACTIC ACID, PLASMA: Lactic Acid, Venous: 2.7 mmol/L (ref 0.5–1.9)

## 2016-12-21 LAB — PROCALCITONIN: Procalcitonin: 3.88 ng/mL

## 2016-12-21 LAB — PROTIME-INR
INR: 2.17
Prothrombin Time: 24.5 seconds — ABNORMAL HIGH (ref 11.4–15.2)

## 2016-12-21 LAB — AMMONIA: AMMONIA: 81 umol/L — AB (ref 9–35)

## 2016-12-21 LAB — MRSA PCR SCREENING: MRSA BY PCR: NEGATIVE

## 2016-12-21 MED ORDER — POLYETHYLENE GLYCOL 3350 17 GM/SCOOP PO POWD
0.5000 | Freq: Once | ORAL | Status: AC
Start: 2016-12-21 — End: 2016-12-21
  Administered 2016-12-21: 127.5 g via ORAL
  Filled 2016-12-21: qty 255

## 2016-12-21 MED ORDER — ALBUTEROL SULFATE (2.5 MG/3ML) 0.083% IN NEBU: 2.5000 mg | INHALATION_SOLUTION | RESPIRATORY_TRACT | Status: DC | PRN

## 2016-12-21 MED ORDER — DEXTROSE 50 % IV SOLN
INTRAVENOUS | Status: AC
  Administered 2016-12-21: 50 mL
  Filled 2016-12-21: qty 50

## 2016-12-21 MED ORDER — POLYETHYLENE GLYCOL 3350 17 G PO PACK
17.0000 g | PACK | Freq: Every day | ORAL | Status: DC
  Administered 2016-12-22: 17 g via ORAL
  Filled 2016-12-21: qty 1

## 2016-12-21 MED ORDER — PANTOPRAZOLE SODIUM 40 MG PO TBEC
40.0000 mg | DELAYED_RELEASE_TABLET | Freq: Every day | ORAL | Status: DC
  Administered 2016-12-21 – 2016-12-22 (×2): 40 mg via ORAL
  Filled 2016-12-21 (×2): qty 1

## 2016-12-21 MED ORDER — DEXTROSE 10 % IV SOLN
INTRAVENOUS | Status: DC
  Administered 2016-12-21 – 2016-12-22 (×3): via INTRAVENOUS

## 2016-12-21 MED ORDER — METRONIDAZOLE 500 MG PO TABS
500.0000 mg | ORAL_TABLET | Freq: Three times a day (TID) | ORAL | Status: DC
  Administered 2016-12-21 – 2016-12-22 (×5): 500 mg via ORAL
  Filled 2016-12-21 (×5): qty 1

## 2016-12-21 MED ORDER — SODIUM CHLORIDE 0.9 % IV BOLUS (SEPSIS)
2000.0000 mL | Freq: Once | INTRAVENOUS | Status: AC
  Administered 2016-12-21: 2000 mL via INTRAVENOUS

## 2016-12-21 MED ORDER — NICOTINE 21 MG/24HR TD PT24
21.0000 mg | MEDICATED_PATCH | Freq: Every day | TRANSDERMAL | Status: DC
  Administered 2016-12-21 – 2016-12-22 (×2): 21 mg via TRANSDERMAL
  Filled 2016-12-21 (×2): qty 1

## 2016-12-21 MED ORDER — SODIUM CHLORIDE 0.9% FLUSH
3.0000 mL | Freq: Two times a day (BID) | INTRAVENOUS | Status: DC
  Administered 2016-12-22: 3 mL via INTRAVENOUS

## 2016-12-21 MED ORDER — DEXTROSE 50 % IV SOLN: 50.0000 mL | INTRAVENOUS | Status: DC | PRN

## 2016-12-21 MED ORDER — DEXTROSE 5 % IV SOLN
1.0000 g | INTRAVENOUS | Status: DC
  Administered 2016-12-21 – 2016-12-22 (×2): 1 g via INTRAVENOUS
  Filled 2016-12-21 (×3): qty 1

## 2016-12-21 MED ORDER — ONDANSETRON HCL 4 MG/2ML IJ SOLN: 4.0000 mg | Freq: Four times a day (QID) | INTRAMUSCULAR | Status: DC | PRN

## 2016-12-21 MED ORDER — GLYCOPYRROLATE 0.2 MG/ML IJ SOLN: 0.2000 mg | INTRAMUSCULAR | Status: DC | PRN

## 2016-12-21 MED ORDER — ALBUMIN HUMAN 25 % IV SOLN
50.0000 g | Freq: Once | INTRAVENOUS | Status: AC
  Administered 2016-12-21: 50 g via INTRAVENOUS
  Filled 2016-12-21: qty 200
  Filled 2016-12-21: qty 100

## 2016-12-21 MED ORDER — ONDANSETRON HCL 4 MG PO TABS: 4.0000 mg | ORAL_TABLET | Freq: Four times a day (QID) | ORAL | Status: DC | PRN

## 2016-12-21 MED ORDER — VANCOMYCIN HCL IN DEXTROSE 750-5 MG/150ML-% IV SOLN
750.0000 mg | INTRAVENOUS | Status: DC
  Administered 2016-12-21 – 2016-12-22 (×2): 750 mg via INTRAVENOUS
  Filled 2016-12-21 (×2): qty 150

## 2016-12-21 MED ORDER — POLYETHYLENE GLYCOL 3350 17 G PO PACK: 17.0000 g | PACK | Freq: Every day | ORAL | Status: DC

## 2016-12-21 MED ORDER — INDOMETHACIN 50 MG RE SUPP
100.0000 mg | Freq: Once | RECTAL | Status: DC
  Filled 2016-12-21: qty 2

## 2016-12-21 MED ORDER — OXYCODONE HCL 20 MG/ML PO CONC: 5.0000 mg | ORAL | Status: DC | PRN

## 2016-12-21 NOTE — Progress Notes (Signed)
Pt was a direct admit from [facility], transported by care link, pt was alert on arrival, oriented to self, self introduced to pt, ID bracelet checked, fall prevention plan discussed with pt but will need re-enforcement, pt hooked to tele and CCMD informed skin assessment done with Sonia Baller, treatment started and will continue to monitor

## 2016-12-21 NOTE — Progress Notes (Signed)
PROGRESS NOTE  Grace Summers [id number] DOB: 1934/03/01 DOA: 12/21/2016 PCP: No PCP Per Patient  HPI/Recap of past 24 hours:  Patient open eyes to voice, not following commands  Assessment/Plan: Principal Problem:   Sepsis ([place]) Active Problems:   Liver mass   DNR (do not resuscitate)   Diarrhea   HTN (hypertension)   Dementia   Tobacco abuse   Acute renal failure superimposed on stage 3 chronic kidney disease ([place])   Acute respiratory failure with hypoxia ([place])  Sepsis ([place]) likely due to ascending cholangitis: Pt had CT of abdomen/pelvis without contrast, which showed severe intrahepatic ductal dilation with questionable distal common bile duct stone. GI, dr. Hilarie Fredrickson was consulted. Lactate 2.7. Currently hemodynamically stable. -will admit to tele bed as inpt -continue vancomycin and cefepime -Also continue Flagyl for diarrhea to cover possible C. difficile colitis --will get Procalcitonin and trend lactic acid levels per sepsis protocol. -IVF: 2L of NS bolus in ED, followed by 75 cc/h  -will give 50 g of albumin by IV due to severe malnutrition (albumin<1.0)  Obstructive jaundice with mass highly suspicious for cholangiocarcinoma: GI was consulted for possible ERCP. For now, needs to determine goals of care for the patient. -need to contact family to discuss the goal of care in AM -f/u GI recommendations -Palliative care consult  Acute respiratory failure with hypoxia: Etiology is not clear. Patient does not seem to have for respiratory distress. Stat ABG showed pH of 7.303, PCO2 22 and PCO2 75 -will get CXR -prn albuterol -Oxygen supplement  Diarrhea: -check c diff pcr -continue flagyl for now  HTN: -Hold bp med due to soft blood pressure  Tobacco abuse: -nicotine patch  AoCKD-III: Baseline Cre is 1.05 on 12/10/16, pt's Cre is 2.25 on admission. Likely due to prerenal secondary to dehydration - IVF as above - Check FeNa  - Follow up renal function by  BMP  Code Status: DNR  Family Communication: patient   Disposition Plan: likely will benefit from residential hospice, pending palliative care    Consultants:  Gi  Palliative care  Procedures:  none  Antibiotics:  Vanc/cefepime/flagyl   Objective: BP (!) 100/36 (BP Location: Left Arm)   Pulse 85   Resp 19   SpO2 100%   Intake/Output Summary (Last 24 hours) at 12/21/16 1022 Last data filed at 12/21/16 0655  Gross per 24 hour  Intake              700 ml  Output                0 ml  Net              700 ml   There were no vitals filed for this visit.  Exam:   General:  Very frail, open eyes to voice, not following commands, + jaudice   Cardiovascular: RRR  Respiratory: CTABL  Abdomen: Soft/ND/NT, positive BS  Musculoskeletal: diffuse Edema  Neuro: open eyes to voice, not following commands,  Data Reviewed: Basic Metabolic Panel:  Recent Labs Lab 12/21/16 0442  NA 140  K 4.2  CL 115*  CO2 11*  GLUCOSE 131*  BUN 59*  CREATININE 2.26*  CALCIUM 6.8*   Liver Function Tests:  Recent Labs Lab 12/21/16 0442  AST 115*  ALT 46  ALKPHOS 349*  BILITOT 10.5*  PROT 5.0*  ALBUMIN <1.0*   No results for input(s): LIPASE, AMYLASE in the last 168 hours.  Recent Labs Lab 12/21/16 0535  AMMONIA 81*  CBC: No results for input(s): WBC, NEUTROABS, HGB, HCT, MCV, PLT in the last 168 hours. Cardiac Enzymes:   No results for input(s): CKTOTAL, CKMB, CKMBINDEX, TROPONINI in the last 168 hours. BNP (last 3 results)  Recent Labs  12/06/16 0926  BNP 73.0    ProBNP (last 3 results) No results for input(s): PROBNP in the last 8760 hours.  CBG:  Recent Labs Lab 12/21/16 0418 12/21/16 0701 12/21/16 0714 12/21/16 0809 12/21/16 0922  GLUCAP 131* 76 121* 104* 129*    No results found for this or any previous visit (from the past 240 hour(s)).   Studies: Dg Chest Port 1 View  Result Date: 12/21/2016 CLINICAL DATA:  Acute respiratory  failure with hypoxia up. EXAM: PORTABLE CHEST 1 VIEW COMPARISON:  12/06/2016 FINDINGS: The patient has developed abnormal density in the lower chest bilaterally most consistent with effusions and volume loss. Lower lobe pneumonia could be coexistent. There is venous hypertension with mild interstitial edema. Aortic atherosclerosis is noted. Cardiac silhouette seems normal in size. Chronic pleural calcified plaque at the left apex. IMPRESSION: Development of bilateral effusions with lower lobe atelectasis. Venous hypertension with mild interstitial edema. Lower lobe pneumonia could be coexistent. Findings most consistent with fluid overload/CHF. Electronically Signed   By: Nelson Chimes M.D.   On: 12/21/2016 08:29    Scheduled Meds: . ceFEPime (MAXIPIME) IV  1 g Intravenous Q24H  . metroNIDAZOLE  500 mg Oral Q8H  . nicotine  21 mg Transdermal Daily  . pantoprazole  40 mg Oral Q0600  . [START ON 12/22/2016] polyethylene glycol  17 g Oral Daily  . polyethylene glycol powder  0.5 Container Oral Once  . sodium chloride flush  3 mL Intravenous Q12H  . vancomycin  750 mg Intravenous Q24H    Continuous Infusions: . dextrose 50 mL/hr at 12/21/16 0615     Time spent: 74mins  From 8:30 to 9:10 am , more than 50% of time spent on coordination of care with gi and palliative care  Charlaine Utsey MD, PhD  [facility] Pager [phone]. If 7PM-7AM, please contact night-coverage at [email or web address], password [facility] 12/21/2016, 10:22 AM  LOS: 0 days

## 2016-12-21 NOTE — Consult Note (Signed)
Consultation Note Date: 12/21/2016   Patient Name: Grace Summers  DOB: 02-Jan-1934  MRN: [id number]  Age / Sex: 83 y.o., female  PCP: No Pcp Per Patient Referring Physician: Florencia Reasons, MD  Reason for Consultation: Establishing goals of care as patient continues to decline with newly diagnosed likely cholangiocarcinoma.   HPI/Patient Profile: 83 y.o. female  with past medical history of recent stroke, tobacco abuse, dementia, CKD stage III, recent admission 1/23-1/28 for UTI, obstructive jaundice probably cholangiocarcinoma, GIB, HTN admitted on 12/21/2016 with diarrhea. She was disimpacted 2/7 but is not eating much and only wants to sleep. She is failing and appears to be at the end of her life.   Clinical Assessment and Goals of Care: I met today at Grace Summers. We were joined by her niece. Grace Summers only opened her eyes briefly but awoke and spoke to her niece but only for a couple minutes and then fell back asleep. Her niece became tearful and tells me about Grace Summers's acute decline and "she doesn't even look like herself." I gave brief update to niece who is concerned with her aunt.   I spoke more with Grace Summers daughter [name]. [name] has previously spoken with our palliative NP Megan at [facility] her mother's last admission. She has also spoken to Dr. Ardis Hughs today and understands that there is not much else that can be done. Grace Summers agrees that they do not want aggressive care and is clear that she wants her mother to be comfortable and not to suffer. She struggles with making sure that she has done everything so we agree to continue antibiotics for now. We discuss hospice and comfort care and she tells me that they have been happy with care at [facility]. [name] says that [facility] says they can take her back with hospice and this will make her close to family - I will ask CSW to pursue with  Grace Summers's permission.   Primary Decision Maker Next of kin Grace Summers daughter    SUMMARY Spencer is goal Continue antibiotics for now Transition back to SNF with hospice is possible  Code Status/Advance Care Planning:  DNR   Symptom Management:   Denies symptoms/discomfort.   PRNs added to ensure comfort.   Palliative Prophylaxis:   Aspiration, Bowel Regimen, Delirium Protocol, Frequent Pain Assessment and Turn Reposition  Additional Recommendations (Limitations, Scope, Preferences):  No Artificial Feeding and No Surgical Procedures  Psycho-social/Spiritual:   Desire for further Chaplaincy support:no  Additional Recommendations: Caregiving  Support/Resources and Education on Hospice  Prognosis:   < 2 weeks  Discharge Planning: [place] with Hospice      Primary Diagnoses: Present on Admission: . Diarrhea . DNR (do not resuscitate) . Liver mass . Sepsis ([place]) . HTN (hypertension) . Dementia . Tobacco abuse . Acute renal failure superimposed on stage 3 chronic kidney disease ([place]) . Acute respiratory failure with hypoxia ([place])   I have reviewed the medical record, interviewed the patient and family, and examined  the patient. The following aspects are pertinent.  No past medical history on file. Social History   Social History  . Marital status: Single    Spouse name: N/A  . Number of children: N/A  . Years of education: N/A   Social History Main Topics  . Smoking status: Current Every Day Smoker    Packs/day: 0.50    Years: 60.00    Types: Cigarettes  . Smokeless tobacco: Never Used  . Alcohol use No  . Drug use: No  . Sexual activity: Not Currently    Birth control/ protection: None   Other Topics Concern  . Not on file   Social History Narrative  . No narrative on file   Family History  Problem Relation Age of Onset  . Breast cancer Mother   . Liver cancer Father    Scheduled Meds: .  ceFEPime (MAXIPIME) IV  1 g Intravenous Q24H  . [START ON 12/22/2016] indomethacin  100 mg Rectal Once  . metroNIDAZOLE  500 mg Oral Q8H  . nicotine  21 mg Transdermal Daily  . pantoprazole  40 mg Oral Q0600  . [START ON 12/22/2016] polyethylene glycol  17 g Oral Daily  . sodium chloride flush  3 mL Intravenous Q12H  . vancomycin  750 mg Intravenous Q24H   Continuous Infusions: . dextrose 50 mL/hr at 12/21/16 0615   PRN Meds:.albuterol, dextrose, ondansetron **OR** ondansetron (ZOFRAN) IV No Known Allergies Review of Systems  Unable to perform ROS: Acuity of condition    Physical Exam  Constitutional: She appears well-developed. She appears lethargic.  Frail   HENT:  Head: Normocephalic and atraumatic.  Cardiovascular: Normal rate and regular rhythm.   Pulmonary/Chest: Effort normal. No accessory muscle usage. No tachypnea. No respiratory distress.  Abdominal: Soft. Normal appearance.  Neurological: She appears lethargic. She is disoriented.  Nursing note and vitals reviewed.   Vital Signs: BP (!) 100/36 (BP Location: Left Arm)   Pulse 85   Resp 19   SpO2 100%  Pain Assessment: No/denies pain   Pain Score: 0-No pain   SpO2: SpO2: 100 % O2 Device:SpO2: 100 % O2 Flow Rate: .O2 Flow Rate (L/min): 5 L/min  IO: Intake/output summary:  Intake/Output Summary (Last 24 hours) at 12/21/16 1317 Last data filed at 12/21/16 9379  Gross per 24 hour  Intake              700 ml  Output                0 ml  Net              700 ml    LBM: Last BM Date: 12/21/16 Baseline Weight:   Most recent weight:       Palliative Assessment/Data:   Flowsheet Rows   Flowsheet Row Most Recent Value  Intake Tab  Referral Department  Hospitalist  Unit at Time of Referral  Med/Surg Unit  Palliative Care Primary Diagnosis  Cancer  Date Notified  12/21/16  Palliative Care Type  Return patient Palliative Care  Reason for referral  Clarify Goals of Care  Date of Admission  12/21/16  # of days  IP prior to Palliative referral  0  Clinical Assessment  Psychosocial & Spiritual Assessment  Palliative Care Outcomes      Time Total: 50 min  Greater than 50%  of this time was spent counseling and coordinating care related to the above assessment and plan.  Signed by: Vinie Sill, NP Palliative Medicine  Team Pager # [phone] (M-F 8a-5p) Team Phone # [phone] (Nights/Weekends)

## 2016-12-21 NOTE — Consult Note (Signed)
[facility] Consult: 8:18 AM 12/21/2016  LOS: 0 days    Referring Provider: Dr Erlinda Hong  Primary Care Physician:  No PCP Per Patient  Oncologist:  Dr Grayland Ormond Primary Gastroenterologist:  Dr Aldean Baker (moonlighting GI at [facility]) Dtr: [name]  [phone]   Reason for Consultation:  Obstructive jaundice.    HPI: Grace Summers is a 83 y.o. female.  PMH CVA.  Dementia.  CKD 3.  Htn.  GERD.    1/23-1/28 admit at [facility] with E coli UTI/sepsis.  At this admission found to have  obstructive jaundice and suspicious of cholangiocarcinoma. Ultrasound 1/24: echo shadow at liver edge, ? GB sludge.  Nodular liver with multiple masses, suspicious for mets. Intrahepatic biliary dilatation and proximal extrahepatic biliary dilatation, though cause of the obstruction is not identified.  MRI/MRCP 1/26: limited by motion. Large central hepatic infiltrative mass likely obstructing the upper common bile duct and worrisome for cholangiocarcinoma. Percutaneous biliary drainage may be indicated along with ultrasound guided liver biopsy.  Numerous hepatic mets.  Suspect portal adenopathy and possible tumor obstructing the stomach.  Large volume abdominal ascites, possibly malignant. Bilateral pleural effusions and bibasilar atelectasis.  Hx states dtr declined further evaluation/treatment. GI and oncology recommended palliative care. "These masses are likely malignant in nature and are certainly causing significant jaundice. However, given her clinical condition and the extent of these masses, it is unlikely that she is a candidate for anything other than palliative treatment... should the patient develop significant pruritis, cholangitis or abdominal pain prior to discharge, can pursue an ERCP for palliative stenting"  per Dr Lizbeth Bark.   Additionally had anemia (Hgb 6.7 on 1/25, PRBC x 1, no stool FOBT testing) , so ASA discontinued. Marland Kitchen  Discharged to SNF, hospice.  Pt's dtr thought pt was for ERCP on 2/5 at [facility], but she missed appt due to lack of communication with the SNF. However I called GI in [place] and she was never set up for this, she was set for a oncology appt on 2/6 but rescheduled it for next week.     Developed diarrhea and sent to ED in [place].  Some vague, mild pain in RUQ, no n/v.  CT abdomen/pelvis there: severe intrahepatic ductal dilation with questionable distal common bile duct stone.  Transfer to [facility] due to lack of ERCP coverage.  Covered with broad spectrum Abx for WBC 27K. Temp not elevated.  At one point oxygenation was 76%, now 100% with [place] oxygen.   LFTs remain elevated. AKI. T bil 6.8 to 10.5 in 2 weeks. GFR from 54 to 19 in 10 days.  U/A did not look suspicious for lingering UTI.      Past Surgical History:  Procedure Laterality Date  . CATARACT EXTRACTION      Prior to Admission medications   Medication Sig Start Date End Date Taking? Authorizing Provider  cephALEXin (KEFLEX) 500 MG capsule Take 1 capsule (500 mg total) by mouth every 8 (eight) hours. 12/11/16   Nicholes Mango, MD  feeding supplement, ENSURE ENLIVE, (ENSURE ENLIVE) LIQD Take 237 mLs by  mouth 2 (two) times daily between meals. 12/12/16   Nicholes Mango, MD  metoprolol (LOPRESSOR) 50 MG tablet Take 1 tablet (50 mg total) by mouth 2 (two) times daily. 12/11/16   Nicholes Mango, MD  mouth rinse LIQD solution 15 mLs by Mouth Rinse route 2 (two) times daily. 12/11/16   Nicholes Mango, MD  Multiple Vitamin (MULTIVITAMIN WITH MINERALS) TABS tablet Take 1 tablet by mouth daily. 12/12/16   Nicholes Mango, MD  pantoprazole (PROTONIX) 40 MG tablet Take 1 tablet (40 mg total) by mouth daily. 12/12/16   Nicholes Mango, MD  senna-docusate (SENOKOT-S) 8.6-50 MG tablet Take 1 tablet by mouth at bedtime as needed for mild constipation. 12/11/16   Nicholes Mango, MD     Scheduled Meds: . ceFEPime (MAXIPIME) IV  1 g Intravenous Q24H  . metroNIDAZOLE  500 mg Oral Q8H  . nicotine  21 mg Transdermal Daily  . sodium chloride flush  3 mL Intravenous Q12H  . vancomycin  750 mg Intravenous Q24H   Infusions: . dextrose 50 mL/hr at 12/21/16 0615   PRN Meds: albuterol, dextrose, ondansetron **OR** ondansetron (ZOFRAN) IV   Allergies as of 12/20/2016  . (No Known Allergies)    Family History  Problem Relation Age of Onset  . Breast cancer Mother   . Liver cancer Father     Social History   Social History  . Marital status: Single    Spouse name: N/A  . Number of children: N/A  . Years of education: N/A   Occupational History  . Not on file.   Social History Main Topics  . Smoking status: Current Every Day Smoker    Packs/day: 0.50    Years: 60.00    Types: Cigarettes  . Smokeless tobacco: Never Used  . Alcohol use No  . Drug use: No  . Sexual activity: Not Currently    Birth control/ protection: None   Other Topics Concern  . Not on file   Social History Narrative  . No narrative on file    REVIEW OF SYSTEMS:  As gleaned from the chart and from dtr as the patient isn't able to provide much history Constitutional:  Weakness. ENT:  No nose bleeds Pulm:  No cough or shortness of breath CV:  No palpitations, no LE edema. No chest pain GU:  No hematuria, no frequency GI:  No dysphagia, no bleeding per rectum, no tarry stools.  Trouble with constipation during recent admission.  Heme:  No unusual bleeding or bruising   Transfusions:  Per HPI.  Neuro:  No headaches, no peripheral tingling or numbness Derm:  No itching, no rash or sores.  Endocrine:  No sweats or chills.  No polyuria or dysuria Immunization:  Not queried.  Travel:  None beyond local counties in last few months.    PHYSICAL EXAM: Vital signs in last 24 hours: Vitals:   12/21/16 0703 12/21/16 0713  BP: (!) 121/33 (!) 100/36  Pulse: 72 85  Resp: 19 19   Wt  Readings from Last 3 Encounters:  12/06/16 41.5 kg (91 lb 8 oz)   General: Arousable, comfortable, cachectic, aged AA F. Head:  No asymmetry or swelling.  Eyes:  Scleral icterus, no conjunctival pallor. Ears:  No obvious hearing deficit  Nose:  No discharge or congestion Mouth:  Moist, clear oral MM. Edentulous. Neck:  No mass, no thyromegaly, no JVD. Lungs:  Clear bilaterally, good breath sounds. No cough, no dyspnea. Heart: RRR. No MRG. S1, S2 present. Abdomen:  Thin, soft. Irregular liver edge about 4 cm below costal margin. Minimal if any tenderness in the right upper quadrant..   Rectal: Fecal impaction, managed to disimpact patient and what emerged was abundance of a very light brown soft stool.    Musc/Skeltl: No obvious joint swelling or contracture deformities. Extremities:  No CCE.  Neurologic:  Patient is oriented to herself and answers simple questions appropriately. She is not oriented to year or place. Skin:  No obvious sores, rashes or suspicious lesions.   Psych:  Calm, cooperative.  Intake/Output from previous day: No intake/output data recorded. Intake/Output this shift: No intake/output data recorded.  LAB RESULTS: No results for input(s): WBC, HGB, HCT, PLT in the last 72 hours. BMET Lab Results  Component Value Date   NA 140 12/21/2016   NA 138 12/11/2016   NA 137 12/10/2016   K 4.2 12/21/2016   K 3.5 12/11/2016   K 3.6 12/10/2016   CL 115 (H) 12/21/2016   CL 112 (H) 12/11/2016   CL 113 (H) 12/10/2016   CO2 11 (L) 12/21/2016   CO2 17 (L) 12/11/2016   CO2 19 (L) 12/10/2016   GLUCOSE 131 (H) 12/21/2016   GLUCOSE 77 12/11/2016   GLUCOSE 63 (L) 12/10/2016   BUN 59 (H) 12/21/2016   BUN 31 (H) 12/11/2016   BUN 30 (H) 12/10/2016   CREATININE 2.26 (H) 12/21/2016   CREATININE 0.97 12/11/2016   CREATININE 1.05 (H) 12/10/2016   CALCIUM 6.8 (L) 12/21/2016   CALCIUM 7.5 (L) 12/11/2016   CALCIUM 7.7 (L) 12/10/2016   LFT  Recent Labs  12/21/16 0442    PROT 5.0*  ALBUMIN <1.0*  AST 115*  ALT 46  ALKPHOS 349*  BILITOT 10.5*   PT/INR Lab Results  Component Value Date   INR 2.17 12/21/2016   INR 1.17 12/06/2016   Hepatitis Panel No results for input(s): HEPBSAG, HCVAB, HEPAIGM, HEPBIGM in the last 72 hours. C-Diff No components found for: CDIFF Lipase  No results found for: LIPASE  Drugs of Abuse  No results found for: LABOPIA, COCAINSCRNUR, LABBENZ, AMPHETMU, THCU, LABBARB   RADIOLOGY STUDIES: No results found.   IMPRESSION:   *  Obstructive jaundice with liver mets, double duct sign, (possible malignant) ascites.  ? Cholangitis.  On Maxipime, Vanc and Flagyl.    *  Anemia. Normocytic.  Transfused PRBC x 1 during admission 2 weeks ago.  ASA discontinued then.  No FOBT testing.    *  Diarrhea. Fecal impaction on rectal exam, which was cleared to the extent that it was possible.  Following this patient had abundant soft unformed stool emerge. This is overflow diarrhea.    *  AKI on top of CKD 3.     PLAN:     *  Discontinue telemetry monitoring, constant oxygenation monitoring and contact precautions.  As she is DO NOT RESUSCITATE and has alternative explanation for her "diarrhea".    *  Discussed ERCP with Dr. Ardis Hughs.  (See below)  *  Miralax/golytlely 1/2 prep to clear any residual fecal impaction.  Then daily Miralax.    *  Regular diet.  Collect urine for clx .  Restart her daily oral Protonix.      Azucena Freed  12/21/2016, 8:18 AM Pager: [phone]    ________________________________________________________________________  Velora Heckler GI MD note:  I personally examined the patient, reviewed the data and agree with the assessment and plan described above.  She has an incurable problem (large central liver mass with numerous other  smaller liver lesions).  I suspect this is metastatic cholangiocarcinoma. She has baseline dementia, worsening renal insufficiency and is now requiring 5Liters [place] oxygen (was satting  65 to 76% on 3 liters early this AM) . She is very cachectic.  She may have cholangitis. ERCP in her very frail state would be high risk and would not address her primary problem. I discussed this with her daughter on phone today and I recommended comfort measures. I will leave decision about stopping antibiotics, oxygen to her primary team but her daughter was agreeable to comfort measures.  Please call or page with any further questions or concerns.    Owens Loffler, MD [facility] Pager [phone]

## 2016-12-21 NOTE — H&P (Addendum)
History and Physical    Grace Summers [id number] DOB: 05/14/1934 DOA: 12/21/2016  Referring MD/NP/PA:   PCP: No PCP Per Patient   Patient coming from:  The patient is coming from SNF  At baseline, pt is dependent for most of ADL.   Chief Complaint: diarrhea  HPI: Grace Summers is a 83 y.o. female with medical history significant of stroke, GIB, tobacco abuse, dementia, CKD-III, HTN, GERD, recently diagnosed with obstructive jaundice with mass highly suspicious for cholangiocarcinoma, who presents with diarrhea.  Pt was recently hospitalized at [facility] from 01/23-1/28 due to sepsis secondary to Escherichia coli UTI. Pt was found to have obstructive jaundice with mass highly suspicious for cholangiocarcinoma. She was discharged to SNF after her daughter declined further evaluation/treatment. GI and oncology recommended palliative care. During that admission, pt was also found to have GIB and ASA was d/c'ed.  Pt presented to [facility] today with c/o diarrhea. Pt has dementia, cannot provide detailed information. Patient does not seem to have nausea, vomiting or abdominal pain. Pt had CT of abdomen/pelvis without contrast, which showed severe intrahepatic ductal dilation with questionable distal common bile duct stone. Per report,  patient was scheduled for an misssd an ERCP on Monday and needs this procedure. GI, Dr. Hilarie Fredrickson was consulted and agreed to consult on this pt. Pt was started with vancomycin, cefepime and Flagyl in [place].   hen I saw pt on the floor, she has confusion, and cannot provide accurate history. She has abdominal tenderness on examination. She does not have active cough, shortness breath, chest pain, nausea, vomiting. She moves all extremities.   Patient had hypotension 70/45 which might not be accurate since the repeated blood pressure measurement showed 105/43 after changed bp cuff. Pt's oxygen saturation reading showed 65 to 86%, but pt does not have for  respiratory distress. Stat ABG showed pH of 7.303, PCO2 22 and PCO2 75%.   ED Course: pt was found to have WBC 27, INR 1.6, lipase 15, urinalysis and negative for leukocytes, but positive for nitrates, worsening renal function. Pt is admitted to tele bed as inpt.  Review of Systems: Could not be reviewed accurately due to dementia.  Allergy: No Known Allergies  No past medical history on file.  Past Surgical History:  Procedure Laterality Date  . CATARACT EXTRACTION      Social History:  reports that she has been smoking Cigarettes.  She has a 30.00 pack-year smoking history. She has never used smokeless tobacco. She reports that she does not drink alcohol or use drugs.  Family History:  Family History  Problem Relation Age of Onset  . Breast cancer Mother   . Liver cancer Father      Prior to Admission medications   Medication Sig Start Date End Date Taking? Authorizing Provider  cephALEXin (KEFLEX) 500 MG capsule Take 1 capsule (500 mg total) by mouth every 8 (eight) hours. 12/11/16   Nicholes Mango, MD  feeding supplement, ENSURE ENLIVE, (ENSURE ENLIVE) LIQD Take 237 mLs by mouth 2 (two) times daily between meals. 12/12/16   Nicholes Mango, MD  metoprolol (LOPRESSOR) 50 MG tablet Take 1 tablet (50 mg total) by mouth 2 (two) times daily. 12/11/16   Nicholes Mango, MD  mouth rinse LIQD solution 15 mLs by Mouth Rinse route 2 (two) times daily. 12/11/16   Nicholes Mango, MD  Multiple Vitamin (MULTIVITAMIN WITH MINERALS) TABS tablet Take 1 tablet by mouth daily. 12/12/16   Nicholes Mango, MD  pantoprazole (PROTONIX) 40 MG tablet Take  1 tablet (40 mg total) by mouth daily. 12/12/16   Nicholes Mango, MD  senna-docusate (SENOKOT-S) 8.6-50 MG tablet Take 1 tablet by mouth at bedtime as needed for mild constipation. 12/11/16   Nicholes Mango, MD    Physical Exam: Vitals:   12/21/16 0329 12/21/16 0346 12/21/16 0505  BP: (!) 70/45 (!) 105/43 (!) 94/40  Pulse: 74 71 70  Resp: 19  19  TempSrc: Oral    SpO2: (!)  76% (!) 65%    General: Not in acute distress. Dry mucus and membrane HEENT:       Eyes: PERRL, EOMI, has scleral icterus.       ENT: No discharge from the ears and nose, no pharynx injection, no tonsillar enlargement.        Neck: No JVD, no bruit, no mass felt. Heme: No neck lymph node enlargement. Cardiac: S1/S2, RRR, No murmurs, No gallops or rubs. Respiratory: No rales, wheezing, rhonchi or rubs. GI: Soft, nondistended, has tenderness diffusely, no rebound pain, no organomegaly, BS present. GU: No hematuria Ext: 3+ pitting leg edema bilaterally. 2+DP/PT pulse bilaterally. Musculoskeletal: No joint deformities, No joint redness or warmth, no limitation of ROM in spin. Skin: No rashes.  Neuro: confused, not oriented X3, cranial nerves II-XII grossly intact, moves all extremities normally. Psych: Patient is not psychotic, no suicidal or hemocidal ideation.  Labs on Admission: I have personally reviewed following labs and imaging studies  CBC: No results for input(s): WBC, NEUTROABS, HGB, HCT, MCV, PLT in the last 168 hours. Basic Metabolic Panel:  Recent Labs Lab 12/21/16 0442  NA 140  K 4.2  CL 115*  CO2 11*  GLUCOSE 131*  BUN 59*  CREATININE 2.26*  CALCIUM 6.8*   GFR: CrCl cannot be calculated (Unknown ideal weight.). Liver Function Tests:  Recent Labs Lab 12/21/16 0442  AST 115*  ALT 46  ALKPHOS 349*  BILITOT 10.5*  PROT 5.0*  ALBUMIN <1.0*   No results for input(s): LIPASE, AMYLASE in the last 168 hours. No results for input(s): AMMONIA in the last 168 hours. Coagulation Profile:  Recent Labs Lab 12/21/16 0442  INR 2.17   Cardiac Enzymes: No results for input(s): CKTOTAL, CKMB, CKMBINDEX, TROPONINI in the last 168 hours. BNP (last 3 results) No results for input(s): PROBNP in the last 8760 hours. HbA1C: No results for input(s): HGBA1C in the last 72 hours. CBG:  Recent Labs Lab 12/21/16 0354 12/21/16 0418  GLUCAP 38* 131*   Lipid  Profile: No results for input(s): CHOL, HDL, LDLCALC, TRIG, CHOLHDL, LDLDIRECT in the last 72 hours. Thyroid Function Tests: No results for input(s): TSH, T4TOTAL, FREET4, T3FREE, THYROIDAB in the last 72 hours. Anemia Panel: No results for input(s): VITAMINB12, FOLATE, FERRITIN, TIBC, IRON, RETICCTPCT in the last 72 hours. Urine analysis:    Component Value Date/Time   COLORURINE AMBER (A) 12/06/2016 1220   APPEARANCEUR CLOUDY (A) 12/06/2016 1220   LABSPEC 1.017 12/06/2016 1220   PHURINE 5.0 12/06/2016 1220   GLUCOSEU NEGATIVE 12/06/2016 1220   HGBUR SMALL (A) 12/06/2016 1220   BILIRUBINUR MODERATE (A) 12/06/2016 1220   KETONESUR NEGATIVE 12/06/2016 1220   PROTEINUR 30 (A) 12/06/2016 1220   NITRITE NEGATIVE 12/06/2016 1220   LEUKOCYTESUR LARGE (A) 12/06/2016 1220   Sepsis Labs: @LABRCNTIP (procalcitonin:4,lacticidven:4) )No results found for this or any previous visit (from the past 240 hour(s)).   Radiological Exams on Admission: No results found.   EKG:   Not done yet, will get one.   Assessment/Plan Principal Problem:  Sepsis ([place]) Active Problems:   Liver mass   DNR (do not resuscitate)   Diarrhea   HTN (hypertension)   Dementia   Tobacco abuse   Acute renal failure superimposed on stage 3 chronic kidney disease (HCC)   Acute respiratory failure with hypoxia (HCC)   Sepsis ([place]) likely due to ascending cholangitis: Pt had CT of abdomen/pelvis without contrast, which showed severe intrahepatic ductal dilation with questionable distal common bile duct stone. GI, dr. Hilarie Fredrickson was consulted. Lactate 2.7. Currently hemodynamically stable. -will admit to tele bed as inpt -continue vancomycin and cefepime -Also continue Flagyl for diarrhea to cover possible C. difficile colitis --will get Procalcitonin and trend lactic acid levels per sepsis protocol. -IVF: 2L of NS bolus in ED, followed by 75 cc/h  -will give 50 g of albumin by IV due to severe malnutrition  (albumin<1.0)  Obstructive jaundice with mass highly suspicious for cholangiocarcinoma: GI was consulted for possible ERCP. For now, needs to determine goals of care for the patient. -need to contact family to discuss the goal of care in AM -f/u GI recommendations -Palliative care consult  Acute respiratory failure with hypoxia: Etiology is not clear. Patient does not seem to have for respiratory distress. Stat ABG showed pH of 7.303, PCO2 22 and PCO2 75 -will get CXR -prn albuterol -Oxygen supplement  Diarrhea: -check c diff pcr -continue flagyl for now  HTN: -Hold bp med due to soft blood pressure  Tobacco abuse: -nicotine patch  AoCKD-III: Baseline Cre is 1.05 on 12/10/16, pt's Cre is 2.25 on admission. Likely due to prerenal secondary to dehydration - IVF as above - Check FeNa  - Follow up renal function by BMP   Sepsis - Re-assessment   Performed at:    6:11 AM   Last Vitals:    Blood pressure (!) 105/43, pulse 71, resp. rate 19, SpO2 (!) 65 %.  Heart:                  Regular rhythm   Lungs:     Clear to auscultation  Capillary Refill:   <2 seconds    Peripheral Pulse (include location): Brachial pulse palpable   Skin (include color):              Pale    DVT ppx: SCD Code Status: DNR Family Communication: None at bed side.     Disposition Plan:  Anticipate discharge back to previous home environment Consults called:  GI, dr. Tonye Royalty Admission status:   Inpatient/tele   Date of Service 12/21/2016    Ivor Costa [facility] Pager [phone]  If 7PM-7AM, please contact night-coverage [email or web address] Password [email or web address] 12/21/2016, 6:11 AM

## 2016-12-21 NOTE — Progress Notes (Signed)
CRITICAL VALUE ALERT  Critical value received:  Lactic 2.7  Date of notification: 12/21/2016  Time of notification:  0532  Critical value read back: yes  Nurse who received alert:  Bing Plume  MD notified (1st page): [place]  Time of first page:  [phone]  MD notified (2nd page):  Time of second page:  Responding MD:  Donna Bernard  Time MD responded:  [phone]

## 2016-12-21 NOTE — Progress Notes (Signed)
Pharmacy Antibiotic Note  Grace Summers is a 84 y.o. female admitted on 12/21/2016 with sepsis - possible ascending cholangitis.  Pharmacy has been consulted for Vancomycin and Cefepime dosing. Also on po Flagyl.  Est CrCl ~30 ml/min  Plan: Cefepime 1gm IV q24h Vancomycin 750mg  IV q24h Will f/u micro data, renal function, and pt's clinical condition Vanc trough prn    No data recorded.  No results for input(s): WBC, CREATININE, LATICACIDVEN, VANCOTROUGH, VANCOPEAK, VANCORANDOM, GENTTROUGH, GENTPEAK, GENTRANDOM, TOBRATROUGH, TOBRAPEAK, TOBRARND, AMIKACINPEAK, AMIKACINTROU, AMIKACIN in the last 168 hours.  CrCl cannot be calculated (Unknown ideal weight.).    No Known Allergies  Antimicrobials this admission: 2/7 Vanc >>  2/7 Cefepime >>  2/7 Flagyl >>  Dose adjustments this admission: n/a  Microbiology results: Pending  Thank you for allowing pharmacy to be a part of this patient's care.  Sherlon Handing, PharmD, BCPS Clinical pharmacist, pager [phone] 12/21/2016 4:20 AM

## 2016-12-21 NOTE — NC FL2 (Signed)
[place] MEDICAID FL2 LEVEL OF CARE SCREENING TOOL     IDENTIFICATION  Patient Name: Grace Summers Birthdate: 1934-08-12 Sex: female Admission Date (Current Location): 12/21/2016  [facility] and [place] Number:  Engineering geologist and Address:  [facility] [place], [place] [address], [place], [place] [address]      Provider Number: [id number]  Attending Physician Name and Address:  Florencia Reasons, MD  Relative Name and Phone Number:       Current Level of Care: Hospital Recommended Level of Care: [place] Prior Approval Number:    Date Approved/Denied:   PASRR Number:    Discharge Plan: SNF    Current Diagnoses: Patient Active Problem List   Diagnosis Date Noted  . Diarrhea 12/21/2016  . HTN (hypertension) 12/21/2016  . Dementia 12/21/2016  . Tobacco abuse 12/21/2016  . Acute renal failure superimposed on stage 3 chronic kidney disease ([place]) 12/21/2016  . Acute respiratory failure with hypoxia ([place]) 12/21/2016  . Liver mass   . Muscle weakness (generalized)   . Adult failure to thrive   . Palliative care by specialist   . Encounter for hospice care discussion   . DNR (do not resuscitate)   . Pressure injury of skin 12/07/2016  . Sepsis ([place]) 12/06/2016    Orientation RESPIRATION BLADDER Height & Weight     Self, Place  Normal Incontinent Weight:   Height:     BEHAVIORAL SYMPTOMS/MOOD NEUROLOGICAL BOWEL NUTRITION STATUS      Incontinent Diet  AMBULATORY STATUS COMMUNICATION OF NEEDS Skin   Extensive Assist Verbally Other (Comment) (DTP on heel)                       Personal Care Assistance Level of Assistance  Bathing, Dressing Bathing Assistance: Maximum assistance Feeding assistance: Limited assistance Dressing Assistance: Maximum assistance     Functional Limitations Info  Hearing   Hearing Info: Impaired      SPECIAL CARE FACTORS FREQUENCY  PT (By licensed PT), OT (By licensed OT)     PT Frequency: 5/wk OT  Frequency: 5/wk            Contractures      Additional Factors Info  Code Status, Allergies Code Status Info: FULL Allergies Info: NKA           Current Medications (12/21/2016):  This is the current hospital active medication list Current Facility-Administered Medications  Medication Dose Route Frequency Provider Last Rate Last Dose  . albuterol (PROVENTIL) (2.5 MG/3ML) 0.083% nebulizer solution 2.5 mg  2.5 mg Nebulization Q4H PRN Ivor Costa, MD      . ceFEPIme (MAXIPIME) 1 g in dextrose 5 % 50 mL IVPB  1 g Intravenous Q24H Franky Macho, RPH   1 g at 12/21/16 [id number]  . dextrose 10 % infusion   Intravenous Continuous Ivor Costa, MD 50 mL/hr at 12/21/16 0615    . dextrose 50 % solution 50 mL  50 mL Intravenous PRN Ivor Costa, MD      . metroNIDAZOLE (FLAGYL) tablet 500 mg  500 mg Oral Q8H Ivor Costa, MD   500 mg at 12/21/16 0600  . nicotine (NICODERM CQ - dosed in mg/24 hours) patch 21 mg  21 mg Transdermal Daily Ivor Costa, MD      . ondansetron [facility]) tablet 4 mg  4 mg Oral Q6H PRN Ivor Costa, MD       Or  . ondansetron [facility]) injection 4 mg  4 mg Intravenous Q6H PRN Ivor Costa, MD      . pantoprazole (PROTONIX) EC tablet 40 mg  40 mg Oral Q0600 Vena Rua, PA-C      . [START ON 12/22/2016] polyethylene glycol (MIRALAX / GLYCOLAX) packet 17 g  17 g Oral Daily Sarah J Gribbin, PA-C      . polyethylene glycol powder (GLYCOLAX/MIRALAX) container 127.5 g  0.5 Container Oral Once Vena Rua, PA-C      . sodium chloride flush (NS) 0.9 % injection 3 mL  3 mL Intravenous Q12H Ivor Costa, MD      . vancomycin (VANCOCIN) IVPB 750 mg/150 ml premix  750 mg Intravenous Q24H Franky Macho, RPH   750 mg at 12/21/16 [id number]     Discharge Medications: Please see discharge summary for a list of discharge medications.  Relevant Imaging Results:  Relevant Lab Results:   Additional Information SSN [id number]  Jorge Ny, LCSW

## 2016-12-22 ENCOUNTER — Encounter (HOSPITAL_COMMUNITY)
Admission: AD | Disposition: A | Discharge status: Hospice | Payer: Self-pay | Source: Other Acute Inpatient Hospital | Attending: Internal Medicine

## 2016-12-22 DIAGNOSIS — Z66 Do not resuscitate: Secondary | ICD-10-CM

## 2016-12-22 DIAGNOSIS — N179 Acute kidney failure, unspecified: Secondary | ICD-10-CM

## 2016-12-22 LAB — CBC
HCT: 23 % — ABNORMAL LOW (ref 36.0–46.0)
HEMOGLOBIN: 7.1 g/dL — AB (ref 12.0–15.0)
MCH: 28.3 pg (ref 26.0–34.0)
MCHC: 30.9 g/dL (ref 30.0–36.0)
MCV: 91.6 fL (ref 78.0–100.0)
Platelets: 317 10*3/uL (ref 150–400)
RBC: 2.51 MIL/uL — AB (ref 3.87–5.11)
RDW: 20 % — ABNORMAL HIGH (ref 11.5–15.5)
WBC: 27.6 10*3/uL — ABNORMAL HIGH (ref 4.0–10.5)

## 2016-12-22 LAB — GLUCOSE, CAPILLARY
GLUCOSE-CAPILLARY: 116 mg/dL — AB (ref 65–99)
GLUCOSE-CAPILLARY: 122 mg/dL — AB (ref 65–99)
GLUCOSE-CAPILLARY: 129 mg/dL — AB (ref 65–99)

## 2016-12-22 SURGERY — ENDOSCOPIC RETROGRADE CHOLANGIOPANCREATOGRAPHY (ERCP) WITH PROPOFOL
Anesthesia: Monitor Anesthesia Care

## 2016-12-22 MED ORDER — VANCOMYCIN HCL 500 MG IV SOLR: 500.0000 mg | INTRAVENOUS | Status: DC

## 2016-12-22 MED ORDER — AMOXICILLIN-POT CLAVULANATE 875-125 MG PO TABS: 1.0000 | ORAL_TABLET | Freq: Two times a day (BID) | ORAL | 0 refills | Status: AC

## 2016-12-22 MED ORDER — POLYETHYLENE GLYCOL 3350 17 G PO PACK: 17.0000 g | PACK | Freq: Every day | ORAL | 0 refills | Status: AC

## 2016-12-22 MED ORDER — OXYCODONE HCL 20 MG/ML PO CONC: 5.0000 mg | ORAL | 0 refills | Status: AC | PRN

## 2016-12-22 NOTE — Clinical Social Work Note (Signed)
Clinical Social Work Assessment  Patient Details  Name: Grace Summers MRN: [id number] Date of Birth: 07-Jun-1934  Date of referral:  12/22/16               Reason for consult:  Facility Placement                Permission sought to share information with:  Chartered certified accountant granted to share information::  Yes, Verbal Permission Granted  Name::     Tax adviser::  SNF  Relationship::  dtr  Contact Information:     Housing/Transportation Living arrangements for the past 2 months:  [place] of Information:  Adult Children Patient Interpreter Needed:  None Criminal Activity/Legal Involvement Pertinent to Current Situation/Hospitalization:  No - Comment as needed Significant Relationships:  Adult Children Lives with:  Self Do you feel safe going back to the place where you live?  No Need for family participation in patient care:  Yes (Comment) (decision making)  Care giving concerns:  Pt came in from SNF- no care concerns with current facility but pt now needing more comfort centered care   Social Worker assessment / plan:  Palliative discussed plan of care with pt dtr- dtr wants pt to be comfortable and easily accessible to family.  Employment status:  Retired Forensic scientist:  Medicare PT Recommendations:  [place] / Referral to community resources:  [place]  Patient/Family's Response to care:  Pt dtr agreeable to plan for pt to DC back to SNF which is nearby family.  Patient/Family's Understanding of and Emotional Response to Diagnosis, Current Treatment, and Prognosis:  Family is realistic about patient prognosis and just want to make her last days comfortable.  Emotional Assessment Appearance:  Appears stated age Attitude/Demeanor/Rapport:  Unable to Assess Affect (typically observed):  Unable to Assess Orientation:  Oriented to Self Alcohol / Substance use:  Not  Applicable Psych involvement (Current and /or in the community):  No (Comment)  Discharge Needs  Concerns to be addressed:  Care Coordination Readmission within the last 30 days:  Yes Current discharge risk:  Terminally ill, Physical Impairment Barriers to Discharge:  No Barriers Identified   Jorge Ny, LCSW 12/22/2016, 10:24 AM

## 2016-12-22 NOTE — Progress Notes (Signed)
Grace Summers to be D/C'd Skilled nursing facility per MD order.  Discussed with the patient and all questions fully answered.  VSS, Skin clean, dry and intact without evidence of skin break down, no evidence of skin tears noted. IV catheter discontinued intact. Site without signs and symptoms of complications. Dressing and pressure applied.  An After Visit Summary was printed and given to the patient. Patient received prescription.  D/c education completed with patient/family including follow up instructions, medication list, d/c activities limitations if indicated, with other d/c instructions as indicated by MD - patient able to verbalize understanding, all questions fully answered.   Patient instructed to return to ED, call 911, or call MD for any changes in condition.   Patient escorted via [place], and D/C home via private auto.  Luci Bank 12/22/2016 6:32 PM

## 2016-12-22 NOTE — Progress Notes (Signed)
Nutrition Brief Note  Chart reviewed. Pt now transitioning to comfort care.  No further nutrition interventions warranted at this time.  Please re-consult as needed.   Shonna Deiter A. Lynette Noah, RD, LDN, CDE Pager: [phone] After hours Pager: [phone]  

## 2016-12-22 NOTE — Discharge Summary (Signed)
Discharge Summary  Grace Summers [id number] DOB: 11-03-1934  PCP: No PCP Per Patient  Admit date: 12/21/2016 Discharge date: 12/22/2016  Time spent: >79mins from 2pm to 2:35pm  Recommendations for Outpatient Follow-up:  1. Follow up with hospice at SNF  Discharge Diagnoses:  Active Hospital Problems   Diagnosis Date Noted  . Sepsis ([place]) 12/06/2016  . Diarrhea 12/21/2016  . HTN (hypertension) 12/21/2016  . Dementia 12/21/2016  . Tobacco abuse 12/21/2016  . Acute renal failure superimposed on stage 3 chronic kidney disease ([place]) 12/21/2016  . Acute respiratory failure with hypoxia ([place]) 12/21/2016  . Goals of care, counseling/discussion   . Palliative care encounter   . Cholangitis   . DNR (do not resuscitate)   . Liver mass     Resolved Hospital Problems   Diagnosis Date Noted Date Resolved  No resolved problems to display.    Discharge Condition: stable  Diet recommendation: diet as tolerated  There were no vitals filed for this visit.  History of present illness:  Patient coming from:  The patient is coming from SNF  At baseline, pt is dependent for most of ADL.   Chief Complaint: diarrhea  HPI: Grace Summers is a 83 y.o. female with medical history significant of stroke, GIB, tobacco abuse, dementia, CKD-III, HTN, GERD, recently diagnosed with obstructive jaundice with mass highly suspicious for cholangiocarcinoma, who presents with diarrhea.  Pt was recently hospitalized at [facility] from 01/23-1/28 due to sepsis secondary to Escherichia coli UTI. Pt was found to have obstructive jaundice with mass highly suspicious for cholangiocarcinoma. She was discharged to SNF after her daughter declined further evaluation/treatment. GI and oncology recommended palliative care. During that admission, pt was also found to have GIB and ASA was d/c'ed.  Pt presented to [facility] today with c/o diarrhea. Pt has dementia, cannot provide detailed information. Patient  does not seem to have nausea, vomiting or abdominal pain. Pt had CT of abdomen/pelvis without contrast, which showed severe intrahepatic ductal dilation with questionable distal common bile duct stone. Per report,  patient was scheduled for an misssd an ERCP on Monday and needs this procedure. GI, Dr. Hilarie Fredrickson was consulted and agreed to consult on this pt. Pt was started with vancomycin, cefepime and Flagyl in [place].   hen I saw pt on the floor, she has confusion, and cannot provide accurate history. She has abdominal tenderness on examination. She does not have active cough, shortness breath, chest pain, nausea, vomiting. She moves all extremities.   Patient had hypotension 70/45 which might not be accurate since the repeated blood pressure measurement showed 105/43 after changed bp cuff. Pt's oxygen saturation reading showed 65 to 86%, but pt does not have for respiratory distress. Stat ABG showed pH of 7.303, PCO2 22 and PCO2 75%.   ED Course: pt was found to have WBC 27, INR 1.6, lipase 15, urinalysis and negative for leukocytes, but positive for nitrates, worsening renal function. Pt is admitted to tele bed as inpt.   Hospital Course:  Principal Problem:   Sepsis ([place]) Active Problems:   Liver mass   DNR (do not resuscitate)   Diarrhea   HTN (hypertension)   Dementia   Tobacco abuse   Acute renal failure superimposed on stage 3 chronic kidney disease (HCC)   Acute respiratory failure with hypoxia (HCC)   Goals of care, counseling/discussion   Palliative care encounter   Cholangitis   Sepsis ([place]) likely due to ascending cholangitis: Pt had CT of abdomen/pelvis without contrast, which showed  severe intrahepatic ductal dilation with questionable distal common bile duct stone.  She received  Vancomycin, flagyl and cefepime -she also received 50 g of albumin by IV due to severe malnutrition (albumin<1.0) -worsening leukocytosis, lactic acid trending up -- return to snf with  hospice  Obstructive jaundice with mass highly suspicious for cholangiocarcinoma: GI consulted who recommend palliative care consult -Palliative care consulted, - return to snf with hospice  Acute respiratory failure with hypoxia:Etiology is not clear. Patient does not seem to have for respiratory distress. Stat ABG showed pH of 7.303, PCO2 22 and PCO2 75 -CXR with bilateral effusions -prn albuterol -Oxygen supplement -- return to snf with hospice  Diarrhea: -likely overflow diarrhea in the setting of severe constipation,  -she has a large bm with very hard stool per RN -on stool softner -- return to snf with hospice  HTN: -Hold bp med due to soft blood pressure -- return to snf with hospice  Tobacco abuse: -nicotine patch  AoCKD-III: Baseline Cre is 1.05 on 12/10/16, pt's Cre is 2.25 on admission. Likely due to prerenal secondary to dehydration - she received IVF  - return to snf with hospice  Code Status: DNR  Family Communication: patient   Disposition Plan: to SNF with hospice care   Consultants:  Gi  Palliative care  Procedures:  none  Antibiotics:  Vanc/cefepime/flagyl    Discharge Exam: BP (!) 97/44 (BP Location: Left Arm)   Pulse (!) 38   Temp 98.6 F (37 C)   Resp 18   SpO2 99%   General: more alert, denies pain, + jaundice  Cardiovascular: RRR Respiratory: CTABL Extremity: diffuse edema  Discharge Instructions You were cared for by a hospitalist during your hospital stay. If you have any questions about your discharge medications or the care you received while you were in the hospital after you are discharged, you can call the unit and asked to speak with the hospitalist on call if the hospitalist that took care of you is not available. Once you are discharged, your primary care physician will handle any further medical issues. Please note that NO REFILLS for any discharge medications will be authorized once you are  discharged, as it is imperative that you return to your primary care physician (or establish a relationship with a primary care physician if you do not have one) for your aftercare needs so that they can reassess your need for medications and monitor your lab values.  Discharge Instructions    Diet general    Complete by:  As directed    Diet as tolerated   Increase activity slowly    Complete by:  As directed      Allergies as of 12/22/2016   No Known Allergies     Medication List    STOP taking these medications   cephALEXin 500 MG capsule Commonly known as:  KEFLEX   feeding supplement (ENSURE ENLIVE) Liqd   metoprolol 50 MG tablet Commonly known as:  LOPRESSOR   mouth rinse Liqd solution   multivitamin with minerals Tabs tablet   pantoprazole 40 MG tablet Commonly known as:  PROTONIX     TAKE these medications   amoxicillin-clavulanate 875-125 MG tablet Commonly known as:  AUGMENTIN Take 1 tablet by mouth 2 (two) times daily.   oxyCODONE 20 MG/ML concentrated solution Commonly known as:  ROXICODONE INTENSOL Take 0.3 mLs (6 mg total) by mouth every 2 (two) hours as needed for severe pain (dyspnea).   polyethylene glycol packet Commonly known  as:  MIRALAX / GLYCOLAX Take 17 g by mouth daily. Start taking on:  12/23/2016   senna-docusate 8.6-50 MG tablet Commonly known as:  Senokot-S Take 1 tablet by mouth at bedtime as needed for mild constipation.   Travoprost (BAK Free) 0.004 % Soln ophthalmic solution Commonly known as:  TRAVATAN Place 1 drop into both eyes at bedtime.      No Known Allergies Contact information for after-discharge care    [facility] .   Specialty:  [place] information: [address] [place] [phone]               The results of significant diagnostics from this hospitalization (including imaging, microbiology, ancillary and laboratory)  are listed below for reference.    Significant Diagnostic Studies: Dg Chest 1 View  Result Date: 12/06/2016 CLINICAL DATA:  Weight loss EXAM: CHEST 1 VIEW COMPARISON:  None. FINDINGS: There is slight scarring in each upper lobe and right base regions. There is no edema or consolidation. Heart size and pulmonary vascularity are normal. No adenopathy. Calcification is noted in the left carotid artery. IMPRESSION: Scattered areas of mild scarring. No edema or consolidation. There is calcification, incompletely visualized, in the left carotid artery. Electronically Signed   By: Lowella Grip III M.D.   On: 12/06/2016 10:49   Ct Head Wo Contrast  Result Date: 12/06/2016 CLINICAL DATA:  83 year old female with right lower extremity weakness for 1 week. Initial encounter. EXAM: CT HEAD WITHOUT CONTRAST TECHNIQUE: Contiguous axial images were obtained from the base of the skull through the vertex without intravenous contrast. COMPARISON:  None. FINDINGS: Brain: Extra-axial CSF over the frontal convexities appears related to age related cerebral volume loss. There is patchy hypodensity in the left pons (series 2, image 12). There is relatively little deep gray matter and cerebral white matter hypodensity elsewhere ; there is some involvement of the right basal ganglia. No superimposed cortical encephalomalacia identified. No midline shift, ventriculomegaly, mass effect, evidence of mass lesion, intracranial hemorrhage or evidence of cortically based acute infarction. Vascular: Calcified atherosclerosis at the skull base. No suspicious intracranial vascular hyperdensity. Skull: Osteopenia. Hyperostosis of the calvarium. No acute osseous abnormality identified. Sinuses/Orbits: Clear. Other: No acute orbit or scalp soft tissue finding. IMPRESSION: 1. Age indeterminate hypodensity in the left thalamus is suspicious for an acute to subacute lacunar infarct in this clinical setting. No associated hemorrhage or mass  effect. 2. Otherwise suspect relatively mild for age chronic small vessel disease. Electronically Signed   By: Genevie Ann M.D.   On: 12/06/2016 10:39   Mr Brain Wo Contrast  Result Date: 12/06/2016 CLINICAL DATA:  RIGHT-sided weakness for 10 days, decreased appetite and weight loss for a few days. Urinary tract infection. Follow-up stroke. EXAM: MRI HEAD WITHOUT CONTRAST MRA HEAD WITHOUT CONTRAST TECHNIQUE: Multiplanar, multiecho pulse sequences of the brain and surrounding structures were obtained without intravenous contrast. Angiographic images of the head were obtained using MRA technique without contrast. COMPARISON:  CT HEAD December 06, 2016 1028 hours FINDINGS: MRI HEAD FINDINGS BRAIN: No reduced diffusion to suggest acute ischemia with particular attention to LEFT thalamus. No susceptibility artifact to suggest hemorrhage. The ventricles and sulci are normal for patient's age. 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 A few scattered subcentimeter supratentorial white matter FLAIR T2 hyperintensities compatible with mild chronic small vessel ischemic disease, less than expected for age. No suspicious parenchymal signal, masses or mass effect.  No abnormal extra-axial fluid collections. No extra-axial masses though, contrast enhanced sequences would be more sensitive. VASCULAR: Normal major intracranial vascular flow voids present at skull base. SKULL AND UPPER CERVICAL SPINE: No abnormal sellar expansion. No suspicious calvarial bone marrow signal. Craniocervical junction maintained. SINUSES/ORBITS: The mastoid air-cells and included paranasal sinuses are well-aerated. Status post bilateral ocular lens implants. The included ocular globes and orbital contents are non-suspicious. OTHER: Patient is edentulous. MRA HEAD FINDINGS- mildly motion degraded examination. ANTERIOR CIRCULATION: Flow related enhancement of the included cervical, petrous, cavernous and supraclinoid internal carotid  arteries. Moderate stenosis RIGHT anterior genu of the internal carotid artery, Patent anterior communicating artery. Occluded proximal RIGHT A1 segment, minimal distal retrograde flow. Patent anterior communicating artery and bilateral A2 segments and distal ACA's. Flow related enhancement bilateral MCA arteries. Moderate tandem stenoses bilateral MCA, mild luminal irregularity bilateral ACA. POSTERIOR CIRCULATION: RIGHT vertebral artery is dominant. Basilar artery is patent, with normal flow related enhancement of the main branch vessels. Flow related enhancement of the posterior cerebral arteries. Tiny LEFT posterior communicating artery present. Multifocal moderate stenoses. ANATOMIC VARIANTS: Fetal origin RIGHT posterior cerebral artery. IMPRESSION: MRI HEAD: No acute intracranial process, specifically no acute ischemia. 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 MRA HEAD: No emergent large vessel occlusion on this mildly motion degraded examination. Multifocal anterior and posterior circulation moderate stenoses compatible with atherosclerosis. Chronically occluded proximal RIGHT A1 segment with patent anterior communicating artery. Electronically Signed   By: Elon Alas M.D.   On: 12/06/2016 16:46   US Carotid Bilateral (at [facility] Only)  Result Date: 12/06/2016 CLINICAL DATA:  Right-sided weakness for 10 days EXAM: BILATERAL CAROTID DUPLEX ULTRASOUND TECHNIQUE: Pearline Cables scale imaging, color Doppler and duplex ultrasound were performed of bilateral carotid and vertebral arteries in the neck. COMPARISON:  None. FINDINGS: Criteria: Quantification of carotid stenosis is based on velocity parameters that correlate the residual internal carotid diameter with NASCET-based stenosis levels, using the diameter of the distal internal carotid lumen as the denominator for stenosis measurement. The following velocity measurements were obtained: RIGHT  ICA:  101 cm/sec CCA:  88 cm/sec SYSTOLIC ICA/CCA RATIO:  1.1 DIASTOLIC ICA/CCA RATIO:  2.6 ECA:  85 cm/sec LEFT ICA:  94 cm/sec CCA:  93 cm/sec SYSTOLIC ICA/CCA RATIO:  1.0 DIASTOLIC ICA/CCA RATIO:  1.4 ECA:  187 cm/sec RIGHT CAROTID ARTERY: Moderate calcified plaque in the bulb. Low resistance internal carotid Doppler pattern. RIGHT VERTEBRAL ARTERY:  Antegrade. LEFT CAROTID ARTERY: Moderate mixed plaque along the wall of the bulb. Low resistance internal carotid Doppler pattern is preserved. LEFT VERTEBRAL ARTERY:  Antegrade. IMPRESSION: Less than 50% stenosis in the right and left internal carotid arteries. Electronically Signed   By: Marybelle Killings M.D.   On: 12/06/2016 16:05   Mr 3d Recon At Scanner  Result Date: 12/09/2016 CLINICAL DATA:  Followup abnormal ultrasound examination. EXAM: MRI ABDOMEN WITHOUT AND WITH CONTRAST (INCLUDING MRCP) TECHNIQUE: Multiplanar multisequence MR imaging of the abdomen was performed both before and after the administration of intravenous contrast. Heavily T2-weighted images of the biliary and pancreatic ducts were obtained, and three-dimensional MRCP images were rendered by post processing. CONTRAST:  40mL MULTIHANCE GADOBENATE DIMEGLUMINE 529 MG/ML IV SOLN COMPARISON:  Ultrasound 12/07/2016 FINDINGS: Examination is quite limited due to patient motion. Lower chest: Bilateral pleural effusions, right greater than left. Associated overlying atelectasis. No pericardial effusion. No obvious lung lesions. Hepatobiliary: Large irregular enhancing central liver mass with numerous other smaller hepatic  lesions in both lobes. There is also marked intrahepatic biliary dilatation without abrupt cut off of the common hepatic or upper common bile duct. Findings suspicious for cholangiocarcinoma and metastatic disease. Stone filled gallbladder with severe surrounding inflammations/fluid. Pancreas: No mass, inflammation or ductal dilatation. Mild pancreatic atrophy. Spleen:  Normal size.   No focal lesions. Adrenals/Urinary Tract: The adrenal glands and kidneys are grossly normal. Small cysts are noted. Stomach/Bowel: The stomach is markedly distended. Moderate thickening of the distal gastric antrum/ pyloric region with suspected enhancement. Could not exclude tumor involvement of the stomach causing the gastric outlet obstruction. The second portion the duodenum appears grossly normal. The visualized small bowel and colon are grossly normal. Vascular/Lymphatic: The aorta and branch vessels are grossly normal. The portal and splenic veins appear patent. The intrahepatic IVC is compressed by tumor but appears patent. Suspect portable adenopathy/mass. Other: Large volume abdominal ascites, possibly malignant. No obvious omental disease but this would be much better evaluated with CT. Musculoskeletal: No obvious bone lesions. IMPRESSION: 1. Very limited examination due to patient motion. 2. Large central hepatic infiltrative mass likely obstructing the upper common bile duct and worrisome for cholangiocarcinoma. Percutaneous biliary drainage may be indicated along with ultrasound guided liver biopsy. 3. Numerous metastatic hepatic lesions. Suspect portal adenopathy and possible tumor obstructing the stomach. 4. Large volume abdominal ascites, possibly malignant. 5. Cholelithiasis and marked gallbladder wall thickening likely due to cystic duct obstruction. 6. Bilateral pleural effusions and bibasilar atelectasis. 7. CT chest abdomen pelvis may be helpful for further evaluation of metastatic disease. Electronically Signed   By: Marijo Sanes M.D.   On: 12/09/2016 11:01   Dg Chest Port 1 View  Result Date: 12/21/2016 CLINICAL DATA:  Acute respiratory failure with hypoxia up. EXAM: PORTABLE CHEST 1 VIEW COMPARISON:  12/06/2016 FINDINGS: The patient has developed abnormal density in the lower chest bilaterally most consistent with effusions and volume loss. Lower lobe pneumonia could be coexistent. There  is venous hypertension with mild interstitial edema. Aortic atherosclerosis is noted. Cardiac silhouette seems normal in size. Chronic pleural calcified plaque at the left apex. IMPRESSION: Development of bilateral effusions with lower lobe atelectasis. Venous hypertension with mild interstitial edema. Lower lobe pneumonia could be coexistent. Findings most consistent with fluid overload/CHF. Electronically Signed   By: Nelson Chimes M.D.   On: 12/21/2016 08:29   Mr Abdomen Mrcp Moise Boring Contast  Result Date: 12/09/2016 CLINICAL DATA:  Followup abnormal ultrasound examination. EXAM: MRI ABDOMEN WITHOUT AND WITH CONTRAST (INCLUDING MRCP) TECHNIQUE: Multiplanar multisequence MR imaging of the abdomen was performed both before and after the administration of intravenous contrast. Heavily T2-weighted images of the biliary and pancreatic ducts were obtained, and three-dimensional MRCP images were rendered by post processing. CONTRAST:  5mL MULTIHANCE GADOBENATE DIMEGLUMINE 529 MG/ML IV SOLN COMPARISON:  Ultrasound 12/07/2016 FINDINGS: Examination is quite limited due to patient motion. Lower chest: Bilateral pleural effusions, right greater than left. Associated overlying atelectasis. No pericardial effusion. No obvious lung lesions. Hepatobiliary: Large irregular enhancing central liver mass with numerous other smaller hepatic lesions in both lobes. There is also marked intrahepatic biliary dilatation without abrupt cut off of the common hepatic or upper common bile duct. Findings suspicious for cholangiocarcinoma and metastatic disease. Stone filled gallbladder with severe surrounding inflammations/fluid. Pancreas: No mass, inflammation or ductal dilatation. Mild pancreatic atrophy. Spleen:  Normal size.  No focal lesions. Adrenals/Urinary Tract: The adrenal glands and kidneys are grossly normal. Small cysts are noted. Stomach/Bowel: The stomach is markedly distended. Moderate thickening  of the distal gastric antrum/  pyloric region with suspected enhancement. Could not exclude tumor involvement of the stomach causing the gastric outlet obstruction. The second portion the duodenum appears grossly normal. The visualized small bowel and colon are grossly normal. Vascular/Lymphatic: The aorta and branch vessels are grossly normal. The portal and splenic veins appear patent. The intrahepatic IVC is compressed by tumor but appears patent. Suspect portable adenopathy/mass. Other: Large volume abdominal ascites, possibly malignant. No obvious omental disease but this would be much better evaluated with CT. Musculoskeletal: No obvious bone lesions. IMPRESSION: 1. Very limited examination due to patient motion. 2. Large central hepatic infiltrative mass likely obstructing the upper common bile duct and worrisome for cholangiocarcinoma. Percutaneous biliary drainage may be indicated along with ultrasound guided liver biopsy. 3. Numerous metastatic hepatic lesions. Suspect portal adenopathy and possible tumor obstructing the stomach. 4. Large volume abdominal ascites, possibly malignant. 5. Cholelithiasis and marked gallbladder wall thickening likely due to cystic duct obstruction. 6. Bilateral pleural effusions and bibasilar atelectasis. 7. CT chest abdomen pelvis may be helpful for further evaluation of metastatic disease. Electronically Signed   By: Marijo Sanes M.D.   On: 12/09/2016 11:01   Mr Jodene Nam Head/brain [id number] Cm  Result Date: 12/06/2016 CLINICAL DATA:  RIGHT-sided weakness for 10 days, decreased appetite and weight loss for a few days. Urinary tract infection. Follow-up stroke. EXAM: MRI HEAD WITHOUT CONTRAST MRA HEAD WITHOUT CONTRAST TECHNIQUE: Multiplanar, multiecho pulse sequences of the brain and surrounding structures were obtained without intravenous contrast. Angiographic images of the head were obtained using MRA technique without contrast. COMPARISON:  CT HEAD December 06, 2016 1028 hours FINDINGS: MRI HEAD FINDINGS  BRAIN: No reduced diffusion to suggest acute ischemia with particular attention to LEFT thalamus. No susceptibility artifact to suggest hemorrhage. The ventricles and sulci are normal for patient's age. 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 A few scattered subcentimeter supratentorial white matter FLAIR T2 hyperintensities compatible with mild chronic small vessel ischemic disease, less than expected for age. No suspicious parenchymal signal, masses or mass effect. No abnormal extra-axial fluid collections. No extra-axial masses though, contrast enhanced sequences would be more sensitive. VASCULAR: Normal major intracranial vascular flow voids present at skull base. SKULL AND UPPER CERVICAL SPINE: No abnormal sellar expansion. No suspicious calvarial bone marrow signal. Craniocervical junction maintained. SINUSES/ORBITS: The mastoid air-cells and included paranasal sinuses are well-aerated. Status post bilateral ocular lens implants. The included ocular globes and orbital contents are non-suspicious. OTHER: Patient is edentulous. MRA HEAD FINDINGS- mildly motion degraded examination. ANTERIOR CIRCULATION: Flow related enhancement of the included cervical, petrous, cavernous and supraclinoid internal carotid arteries. Moderate stenosis RIGHT anterior genu of the internal carotid artery, Patent anterior communicating artery. Occluded proximal RIGHT A1 segment, minimal distal retrograde flow. Patent anterior communicating artery and bilateral A2 segments and distal ACA's. Flow related enhancement bilateral MCA arteries. Moderate tandem stenoses bilateral MCA, mild luminal irregularity bilateral ACA. POSTERIOR CIRCULATION: RIGHT vertebral artery is dominant. Basilar artery is patent, with normal flow related enhancement of the main branch vessels. Flow related enhancement of the posterior cerebral arteries. Tiny LEFT posterior communicating artery present. Multifocal moderate stenoses. ANATOMIC  VARIANTS: Fetal origin RIGHT posterior cerebral artery. IMPRESSION: MRI HEAD: No acute intracranial process, specifically no acute ischemia. 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 MRA HEAD: No emergent large vessel occlusion on this mildly motion degraded examination. Multifocal anterior and posterior circulation moderate stenoses compatible with atherosclerosis. Chronically occluded  proximal RIGHT A1 segment with patent anterior communicating artery. Electronically Signed   By: Elon Alas M.D.   On: 12/06/2016 16:46   US Abdomen Limited Ruq  Result Date: 12/07/2016 CLINICAL DATA:  Jaundice EXAM: US ABDOMEN LIMITED - RIGHT UPPER QUADRANT COMPARISON:  None FINDINGS: Gallbladder: A normal gallbladder is not visualized. Questionable gallbladder filled with shadowing calculi and sludge versus a bowel loop adjacent to the liver. No sonographic Murphy sign. Common bile duct: Diameter: 10 mm diameter, dilated Liver: Intrahepatic biliary dilatation. Nodular margins with heterogeneous parenchymal echogenicity. Multiple hypoechoic masses suspicious for metastatic disease. Largest nodule measures 2.5 x 3.1 x 4.2 cm in the LEFT lobe. Small amounts of scattered ascites throughout abdomen. Due to shadowing from gallstones and bowel gas, unable to identify a discrete mass obstructing the extrahepatic biliary tree at the porta hepatis or pancreatic head. IMPRESSION: A normal appearing gallbladder is not visualized. A structure with shadowing echogenic foci adjacent to the liver edge may represent a gallbladder filled with shadowing calculi and sludge though a bowel loop is not entirely excluded; recommend correlation with surgical history. Nodular liver with multiple hepatic masses suspicious for hepatic metastatic disease. Intrahepatic biliary dilatation and proximal extrahepatic biliary dilatation, though cause of the obstruction is not identified. Further  assessment by CT abdomen with contrast or MR imaging with and without contrast recommended to assess liver, extrahepatic biliary tree, and gallbladder. Electronically Signed   By: Lavonia Dana M.D.   On: 12/07/2016 11:45    Microbiology: Recent Results (from the past 240 hour(s))  Culture, blood (x 2)     Status: None (Preliminary result)   Collection Time: 12/21/16  4:42 AM  Result Value Ref Range Status   Specimen Description BLOOD RIGHT ANTECUBITAL  Final   Special Requests BOTTLES DRAWN AEROBIC ONLY 5CC  Final   Culture NO GROWTH 1 DAY  Final   Report Status PENDING  Incomplete  Culture, blood (x 2)     Status: None (Preliminary result)   Collection Time: 12/21/16  4:56 AM  Result Value Ref Range Status   Specimen Description BLOOD LEFT ANTECUBITAL  Final   Special Requests IN PEDIATRIC BOTTLE 1CC  Final   Culture NO GROWTH 1 DAY  Final   Report Status PENDING  Incomplete  MRSA PCR Screening     Status: None   Collection Time: 12/21/16 12:11 PM  Result Value Ref Range Status   MRSA by PCR NEGATIVE NEGATIVE Final    Comment:        The GeneXpert MRSA Assay (FDA approved for NASAL specimens only), is one component of a comprehensive MRSA colonization surveillance program. It is not intended to diagnose MRSA infection nor to guide or monitor treatment for MRSA infections.      Labs: Basic Metabolic Panel:  Recent Labs Lab 12/21/16 0442  NA 140  K 4.2  CL 115*  CO2 11*  GLUCOSE 131*  BUN 59*  CREATININE 2.26*  CALCIUM 6.8*   Liver Function Tests:  Recent Labs Lab 12/21/16 0442  AST 115*  ALT 46  ALKPHOS 349*  BILITOT 10.5*  PROT 5.0*  ALBUMIN <1.0*   No results for input(s): LIPASE, AMYLASE in the last 168 hours.  Recent Labs Lab 12/21/16 0535  AMMONIA 81*   CBC:  Recent Labs Lab 12/22/16 0408  WBC 27.6*  HGB 7.1*  HCT 23.0*  MCV 91.6  PLT 317   Cardiac Enzymes: No results for input(s): CKTOTAL, CKMB, CKMBINDEX, TROPONINI in the last  168  hours. BNP: BNP (last 3 results)  Recent Labs  12/06/16 0926  BNP 73.0    ProBNP (last 3 results) No results for input(s): PROBNP in the last 8760 hours.  CBG:  Recent Labs Lab 12/21/16 1742 12/21/16 2011 12/22/16 0010 12/22/16 0751 12/22/16 1206  GLUCAP 131* 126* 129* 122* 116*       SignedFlorencia Reasons MD, PhD  [facility] 12/22/2016, 2:23 PM

## 2016-12-22 NOTE — Progress Notes (Signed)
Patient will discharge to [facility] Anticipated discharge date: 2/8 Family notified: pt dtr at bedside Transportation by [facility]- scheduled for 3:45pm Report #: [id number]  [place] signing off.  Jorge Ny, LCSW Clinical Social Worker [phone]

## 2016-12-26 ENCOUNTER — Inpatient Hospital Stay: Payer: Medicare Other | Admitting: Oncology

## 2016-12-26 LAB — CULTURE, BLOOD (ROUTINE X 2)
CULTURE: NO GROWTH
Culture: NO GROWTH

## 2017-01-12 DEATH — deceased

## 2017-10-16 IMAGING — US US CAROTID DUPLEX BILAT
1 series · 13 of 24 positions shown · non-contrast
Comparison: None.

CLINICAL DATA: Right-sided weakness for 10 days

EXAM:
BILATERAL CAROTID DUPLEX ULTRASOUND
TECHNIQUE: Gray scale imaging, color Doppler and duplex ultrasound were
performed of bilateral carotid and vertebral arteries in the neck.

[Series 1: us carotid duplex bilat · 0.05mm/px · 13 of 64 slices shown]
[im 1/64]
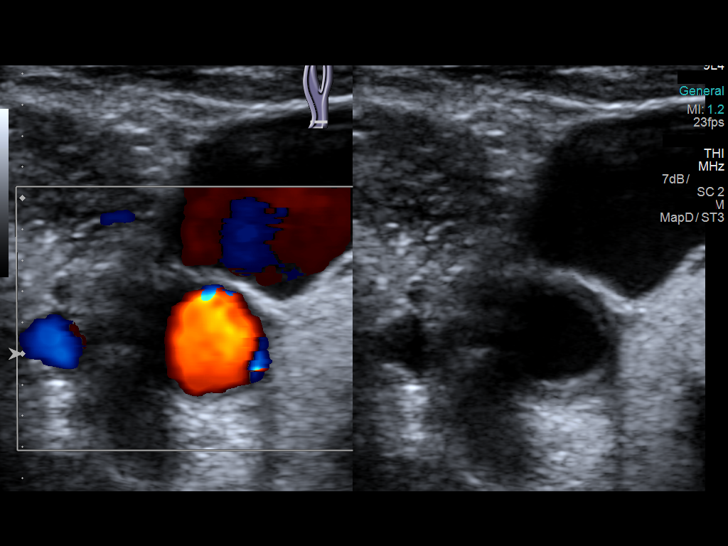
[im 6/64]
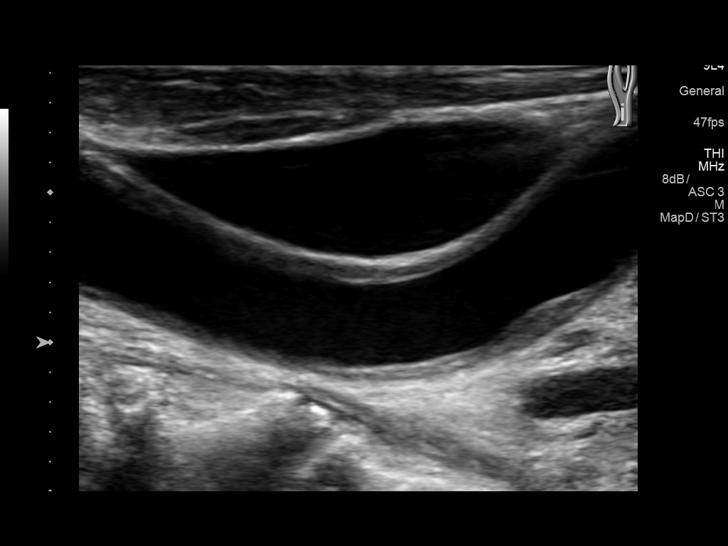
[im 11/64]
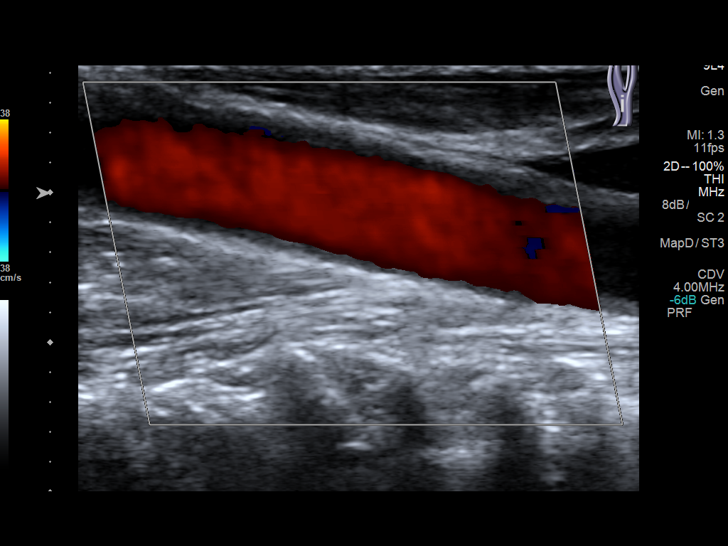
[im 17/64]
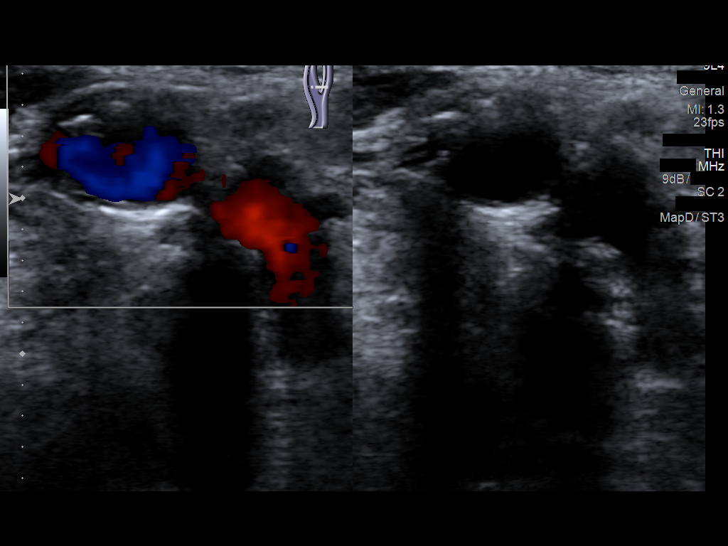
[im 22/64]
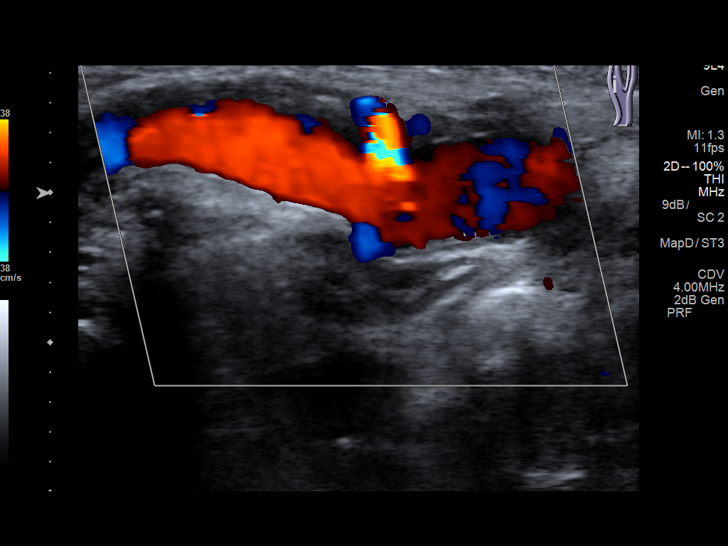
[im 28/64]
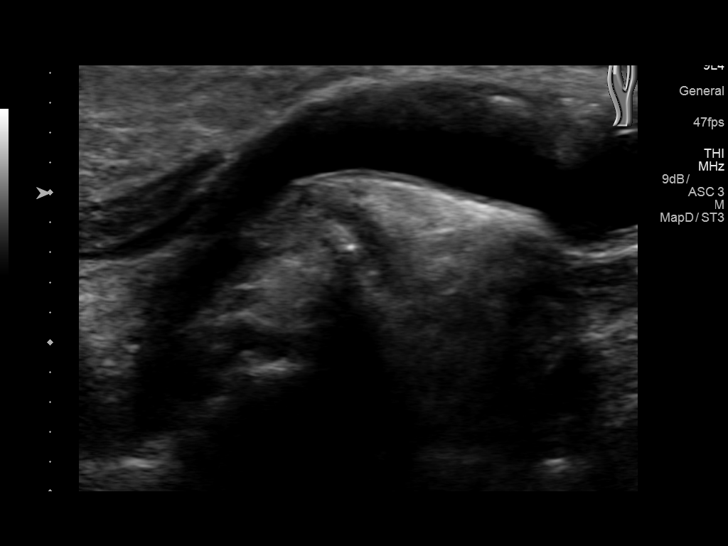
[im 33/64]
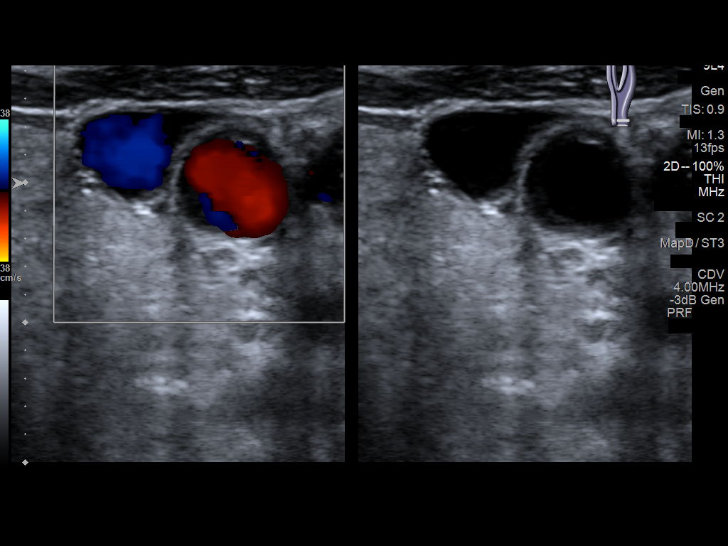
[im 36/64]
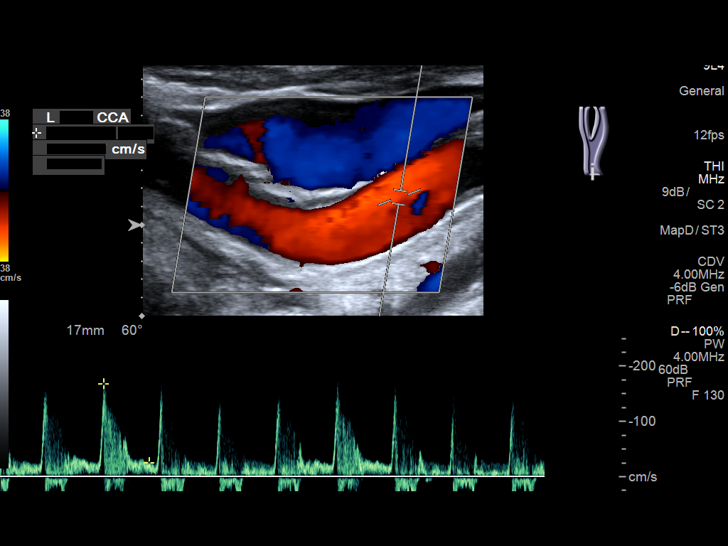
[im 42/64]
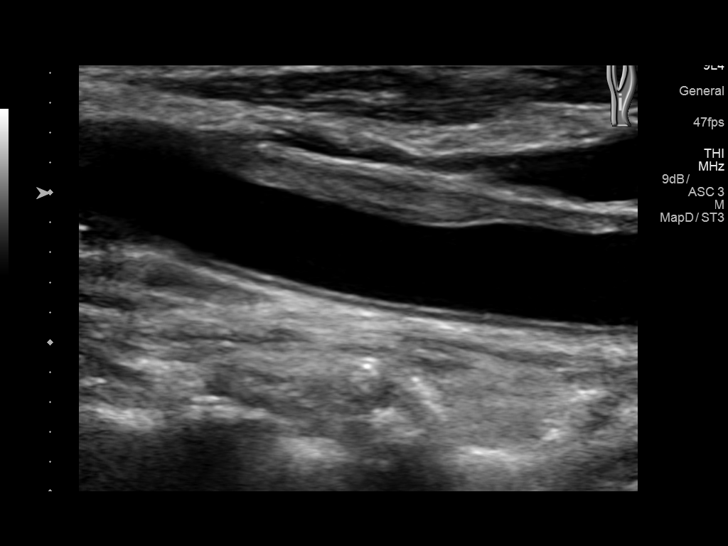
[im 47/64]
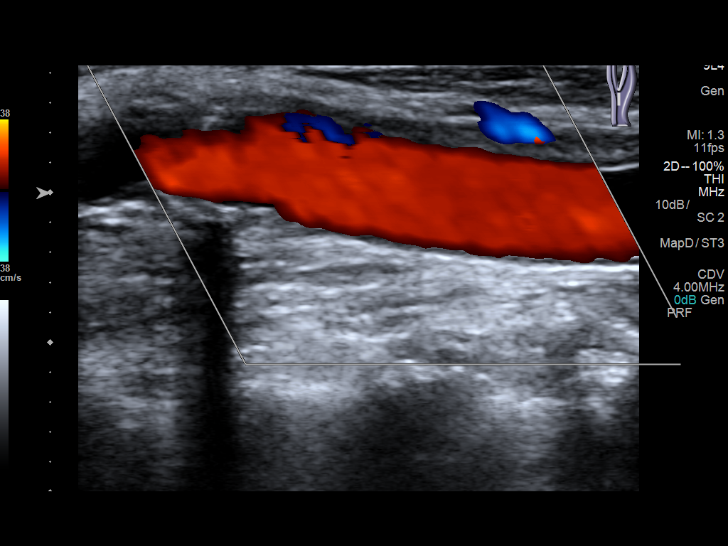
[im 53/64]
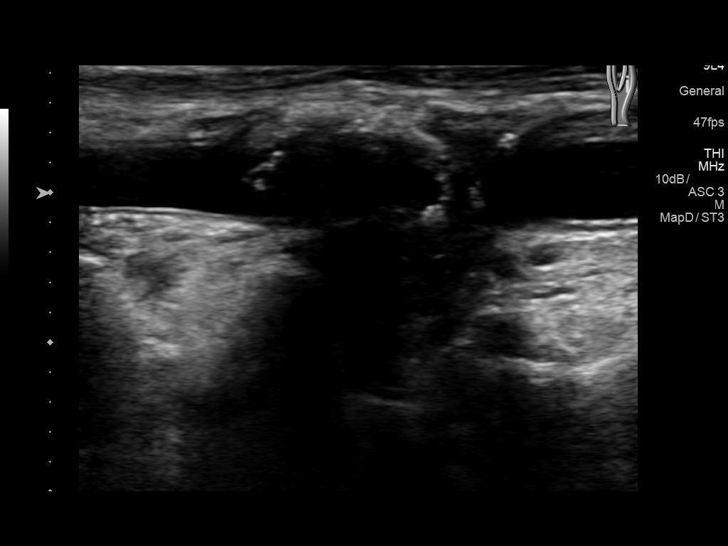
[im 58/64]
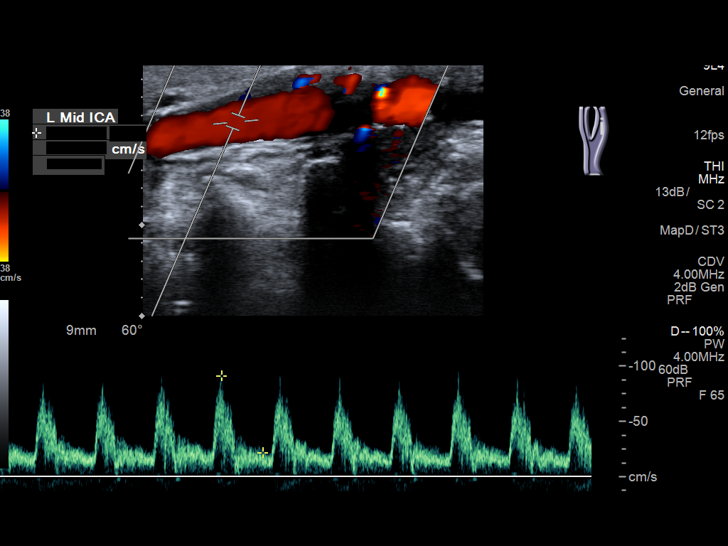
[im 64/64]
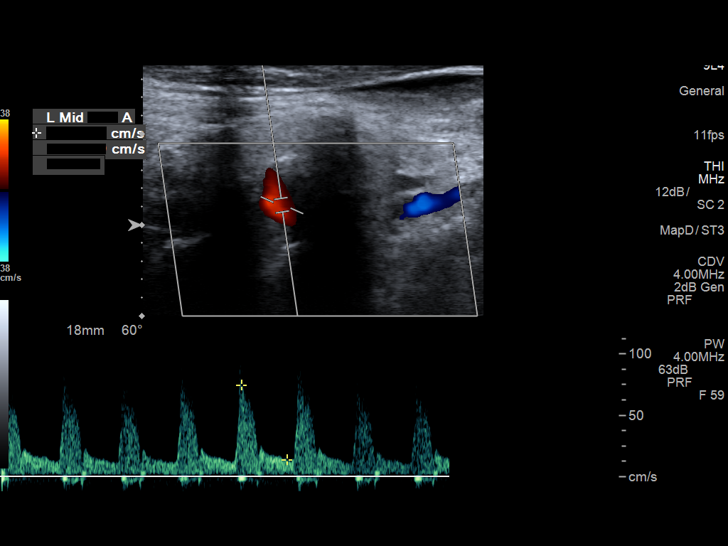

[13 of 24 positions shown; findings below may reference images not displayed]

FINDINGS: Criteria: Quantification of carotid stenosis is based on velocity
parameters that correlate the residual internal carotid diameter
with NASCET-based stenosis levels, using the diameter of the distal
internal carotid lumen as the denominator for stenosis measurement.

The following velocity measurements were obtained:

RIGHT

ICA:  101 cm/sec

CCA:  88 cm/sec

SYSTOLIC ICA/CCA RATIO:

DIASTOLIC ICA/CCA RATIO:

ECA:  85 cm/sec

LEFT

ICA:  94 cm/sec

CCA:  93 cm/sec

SYSTOLIC ICA/CCA RATIO:

DIASTOLIC ICA/CCA RATIO:

ECA:  187 cm/sec

RIGHT CAROTID ARTERY: Moderate calcified plaque in the bulb. Low
resistance internal carotid Doppler pattern.

RIGHT VERTEBRAL ARTERY:  Antegrade.

LEFT CAROTID ARTERY: Moderate mixed plaque along the wall of the
bulb. Low resistance internal carotid Doppler pattern is preserved.

LEFT VERTEBRAL ARTERY:  Antegrade.
IMPRESSION: Less than 50% stenosis in the right and left internal carotid
arteries.

## 2017-10-17 IMAGING — US US ABDOMEN LIMITED
1 series · 13 of 25 positions shown · non-contrast
Comparison: None

CLINICAL DATA: Jaundice

EXAM:
US ABDOMEN LIMITED - RIGHT UPPER QUADRANT

[Series 1: us abdomen limited · 0.14mm/px · 13 of 90 slices shown]
[im 1/90]
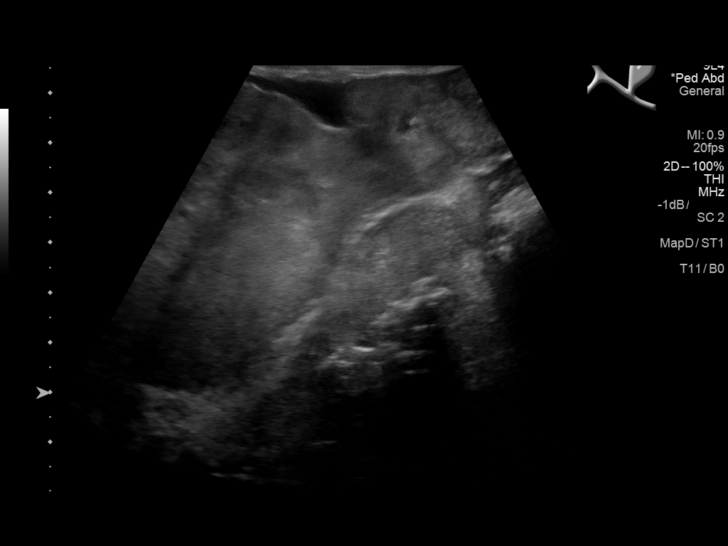
[im 8/90]
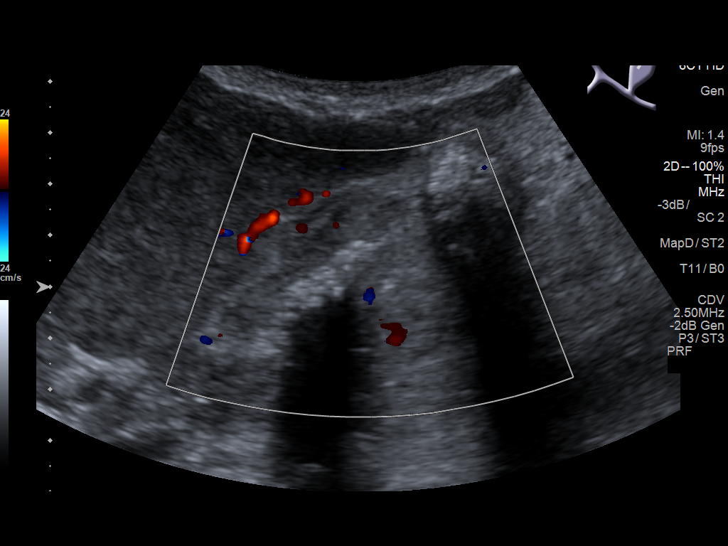
[im 15/90]
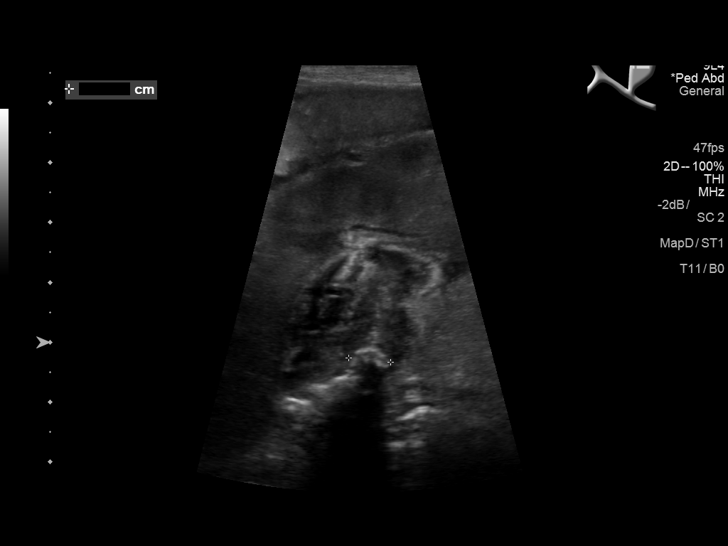
[im 23/90]
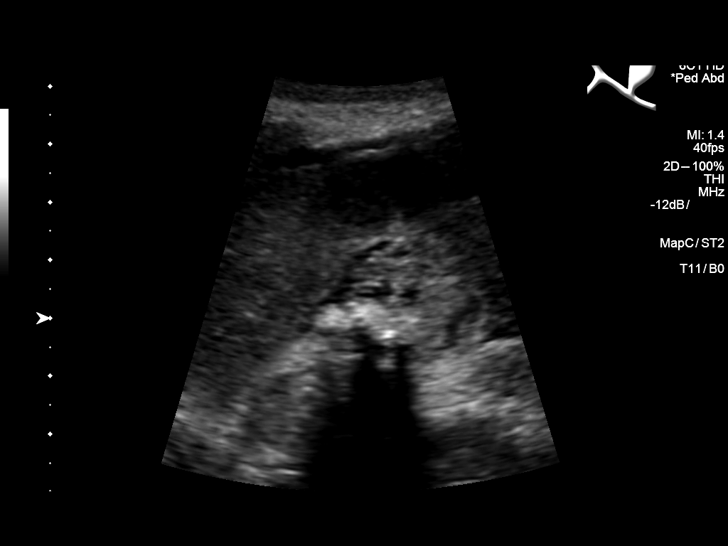
[im 30/90]
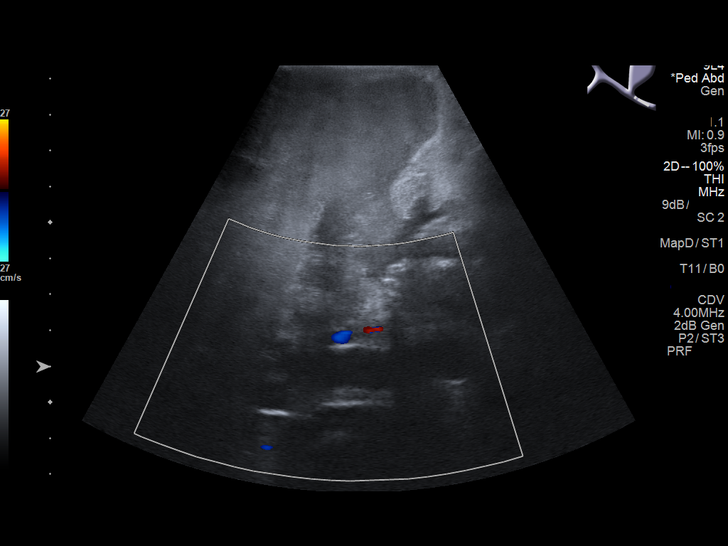
[im 38/90]
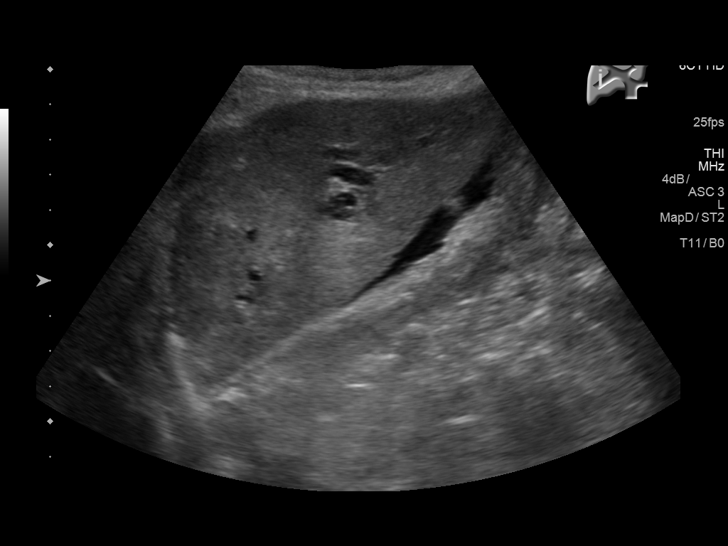
[im 45/90]
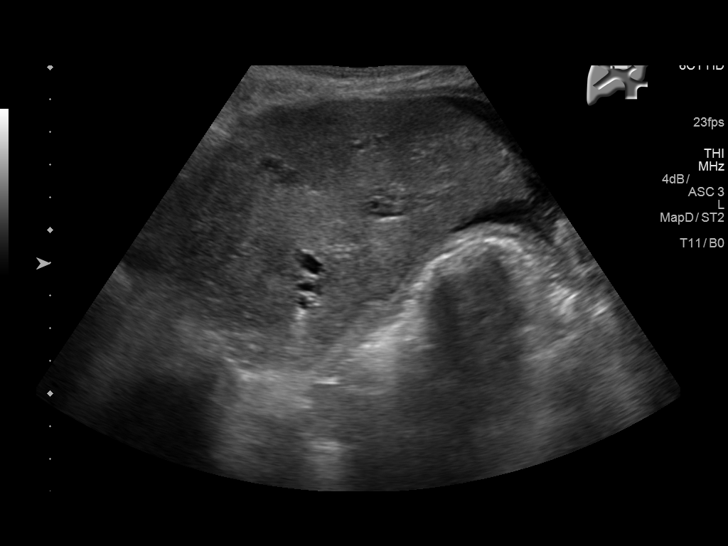
[im 52/90]
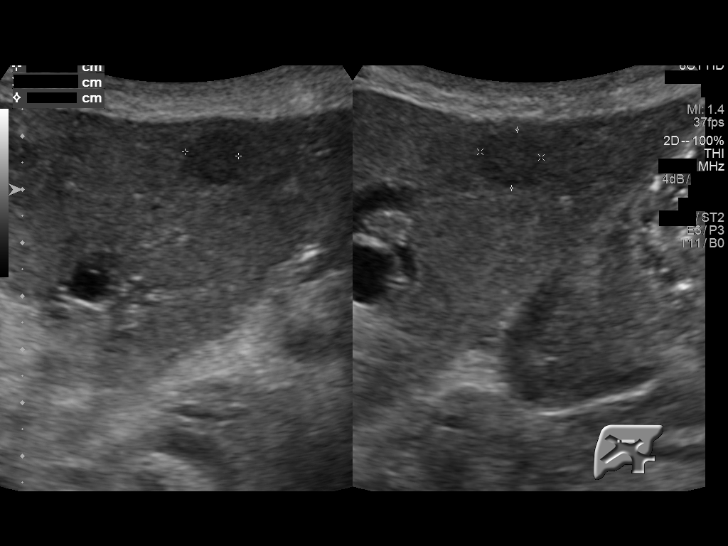
[im 60/90]
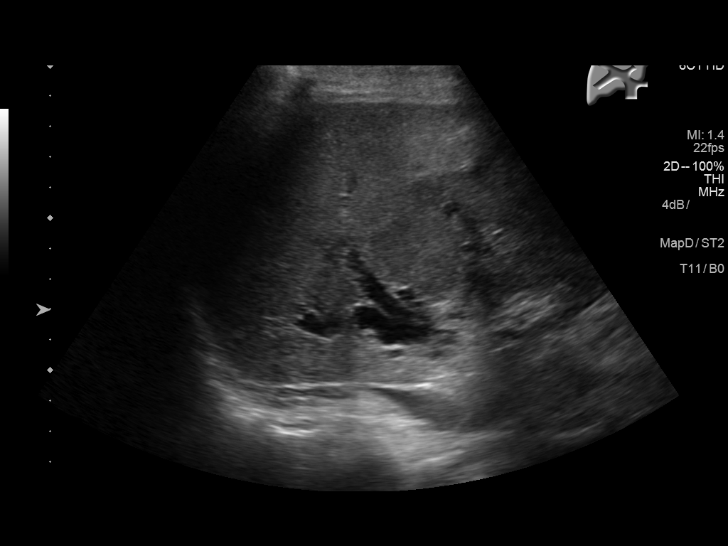
[im 67/90]
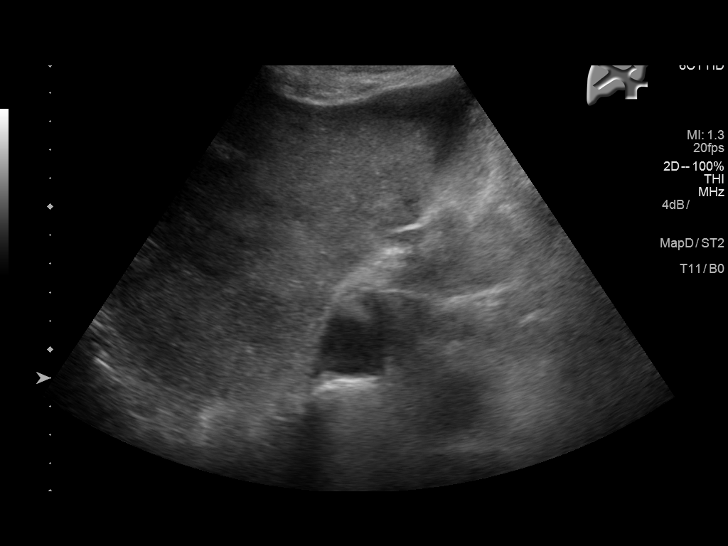
[im 75/90]
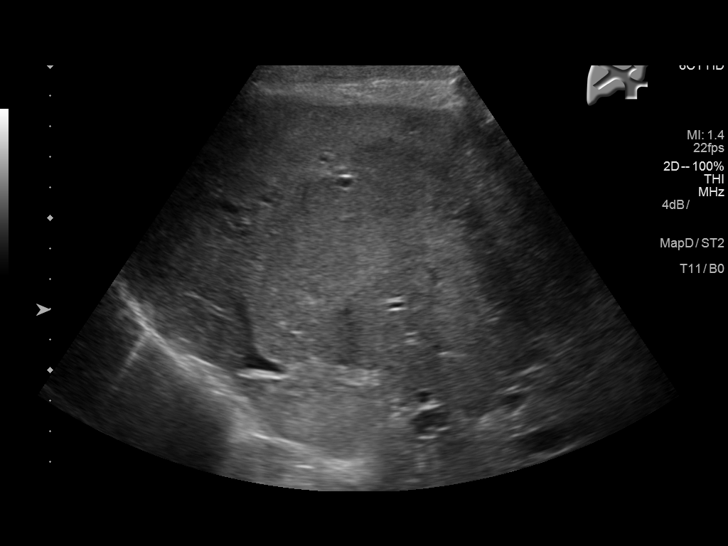
[im 82/90]
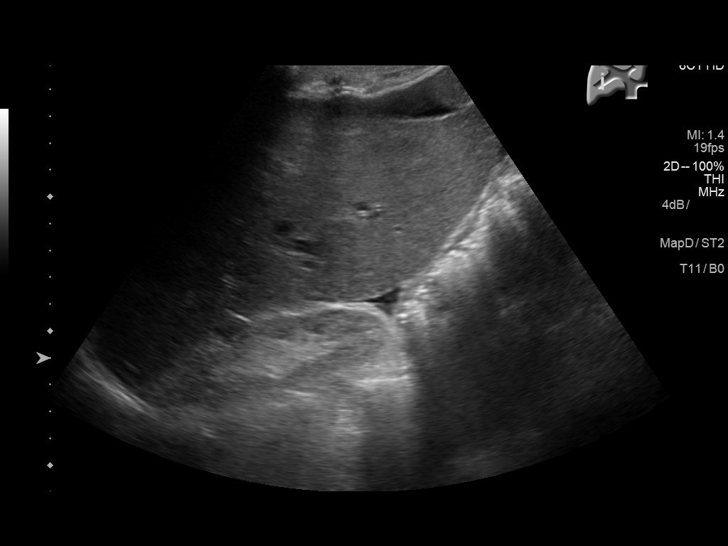
[im 90/90]
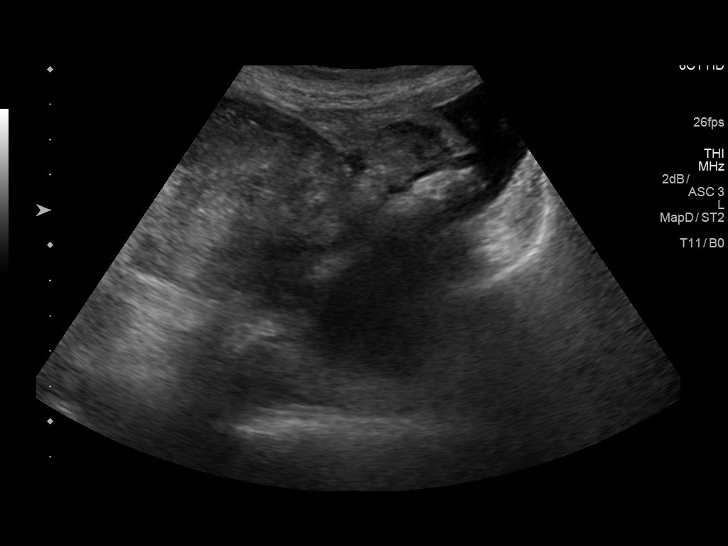

[13 of 25 positions shown; findings below may reference images not displayed]

FINDINGS: Gallbladder:

A normal gallbladder is not visualized. Questionable gallbladder
filled with shadowing calculi and sludge versus a bowel loop
adjacent to the liver. No sonographic Murphy sign.

Common bile duct:

Diameter: 10 mm diameter, dilated

Liver:

Intrahepatic biliary dilatation. Nodular margins with heterogeneous
parenchymal echogenicity. Multiple hypoechoic masses suspicious for
metastatic disease. Largest nodule measures 2.5 x 3.1 x 4.2 cm in
the LEFT lobe.

Small amounts of scattered ascites throughout abdomen.

Due to shadowing from gallstones and bowel gas, unable to identify a
discrete mass obstructing the extrahepatic biliary tree at the porta
hepatis or pancreatic head.
IMPRESSION: A normal appearing gallbladder is not visualized.

A structure with shadowing echogenic foci adjacent to the liver edge
may represent a gallbladder filled with shadowing calculi and sludge
though a bowel loop is not entirely excluded; recommend correlation
with surgical history.

Nodular liver with multiple hepatic masses suspicious for hepatic
metastatic disease.

Intrahepatic biliary dilatation and proximal extrahepatic biliary
dilatation, though cause of the obstruction is not identified.

Further assessment by CT abdomen with contrast or MR imaging with
and without contrast recommended to assess liver, extrahepatic
biliary tree, and gallbladder.

## 2018-03-02 IMAGING — CR DG CHEST 1V
1 series · 1 of 1 positions shown · non-contrast
Comparison: None.

CLINICAL DATA: Weight loss

EXAM:
CHEST 1 VIEW

[x chest ap]
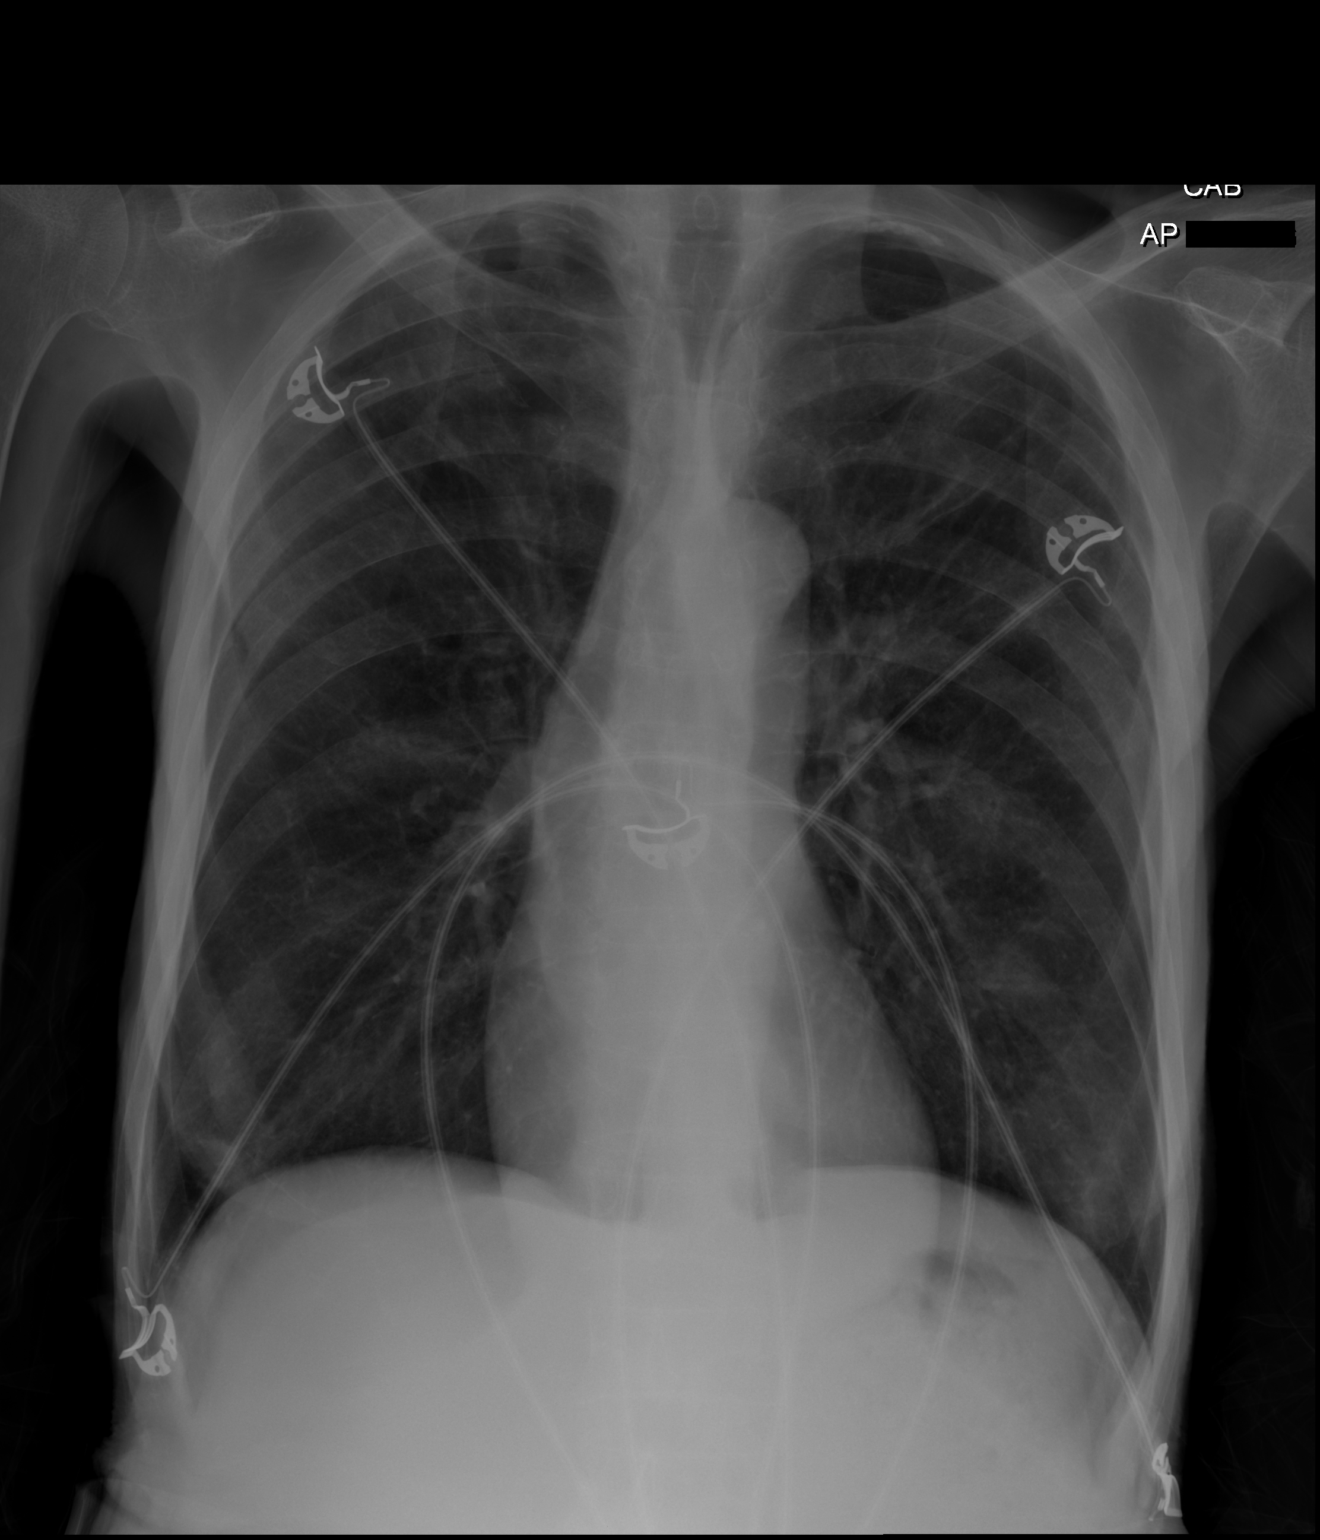

[1 of 1 positions shown; findings below may reference images not displayed]

FINDINGS: There is slight scarring in each upper lobe and right base regions.
There is no edema or consolidation. Heart size and pulmonary
vascularity are normal. No adenopathy. Calcification is noted in the
left carotid artery.
IMPRESSION: Scattered areas of mild scarring. No edema or consolidation. There
is calcification, incompletely visualized, in the left carotid
artery.

## 2018-03-17 IMAGING — CR DG CHEST 1V PORT
1 series · 1 of 1 positions shown · non-contrast
Comparison: 12/06/2016

CLINICAL DATA: Acute respiratory failure with hypoxia up.

EXAM:
PORTABLE CHEST 1 VIEW

[portable]
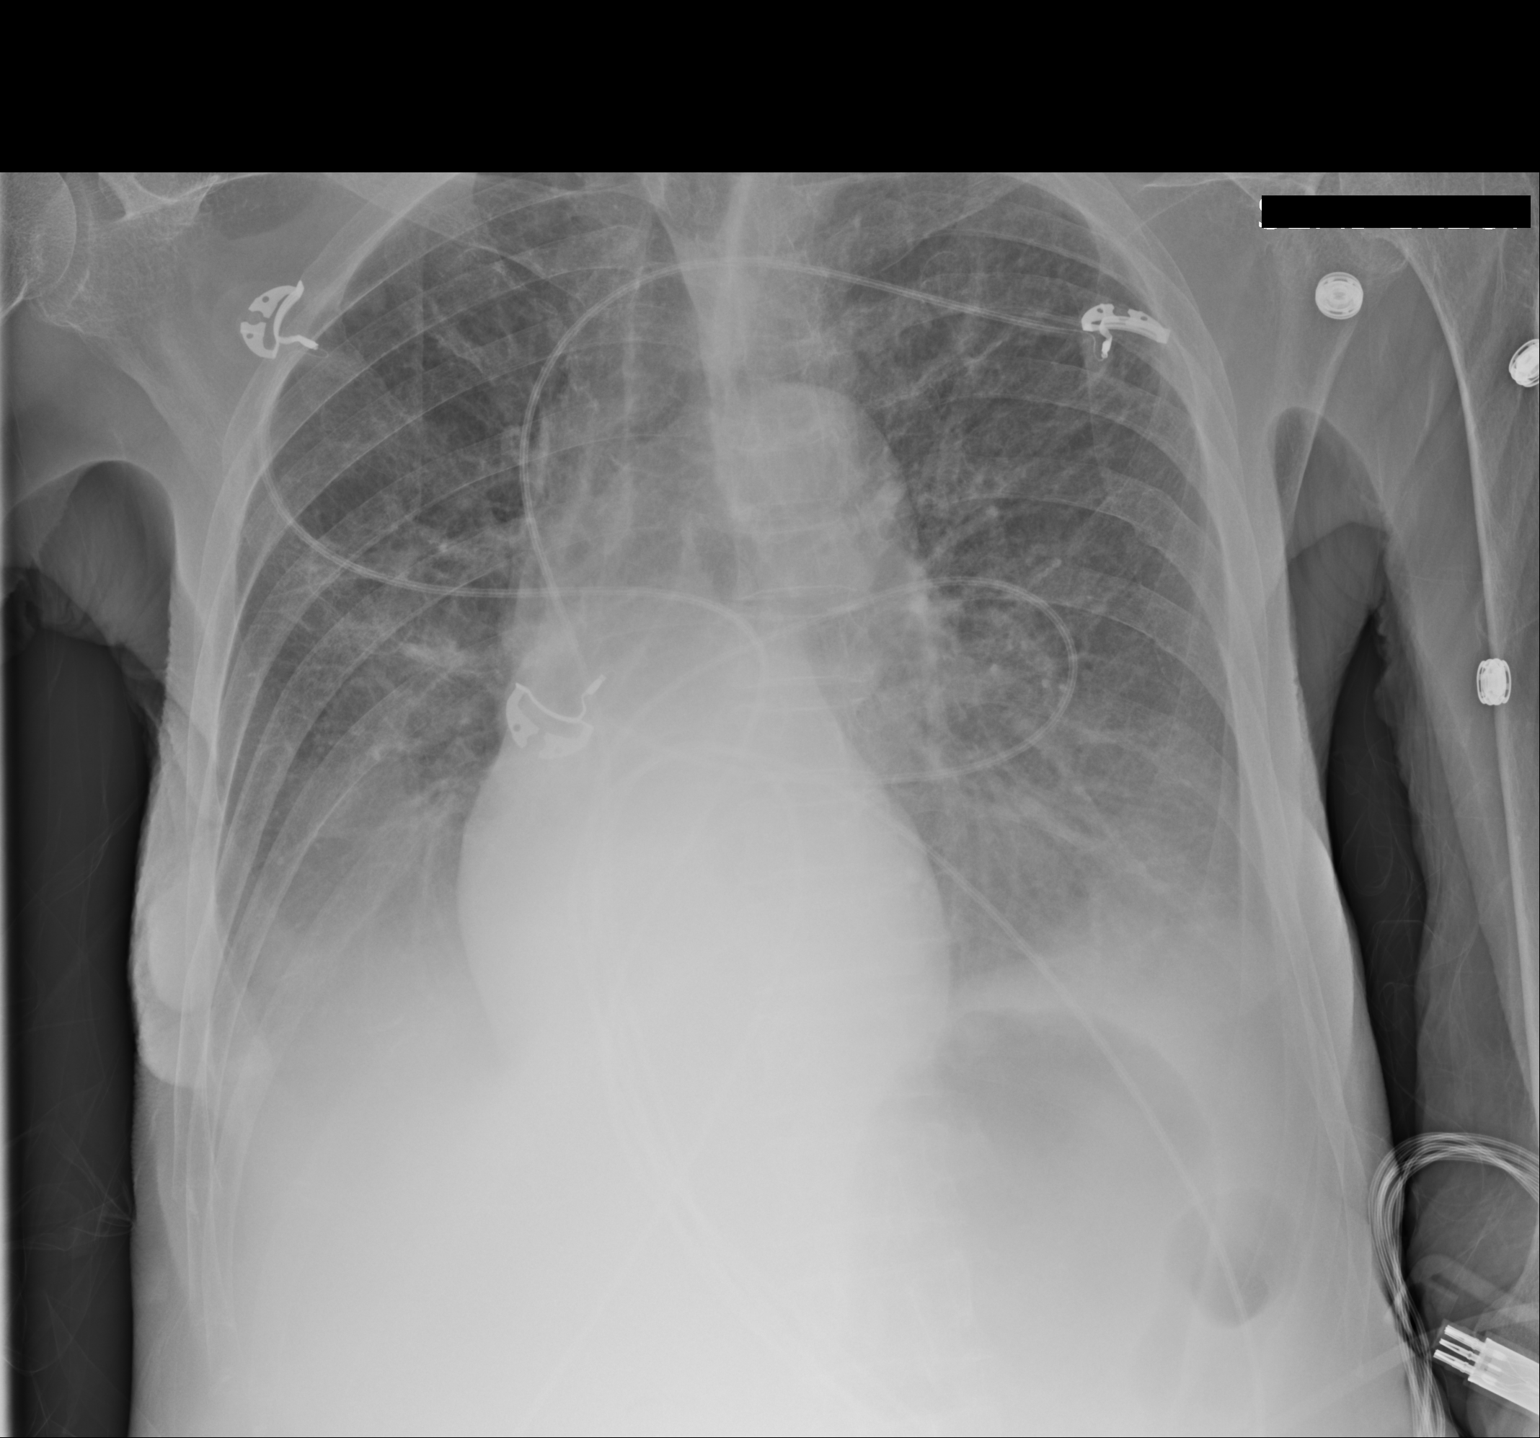

[1 of 1 positions shown; findings below may reference images not displayed]

FINDINGS: The patient has developed abnormal density in the lower chest
bilaterally most consistent with effusions and volume loss. Lower
lobe pneumonia could be coexistent. There is venous hypertension
with mild interstitial edema. Aortic atherosclerosis is noted.
Cardiac silhouette seems normal in size. Chronic pleural calcified
plaque at the left apex.
IMPRESSION: Development of bilateral effusions with lower lobe atelectasis.
Venous hypertension with mild interstitial edema. Lower lobe
pneumonia could be coexistent. Findings most consistent with fluid
overload/CHF.
